# Patient Record
Sex: Male | Born: 1995 | Race: Black or African American | Hispanic: No | Marital: Single | Smoking: Never smoker
Health system: Southern US, Community
[De-identification: ages and names within clinical notes are randomized; demographics above are authoritative.]

## PROBLEM LIST (undated history)

## (undated) DIAGNOSIS — R6520 Severe sepsis without septic shock: Secondary | ICD-10-CM

## (undated) DIAGNOSIS — M79669 Pain in unspecified lower leg: Secondary | ICD-10-CM

## (undated) DIAGNOSIS — Z969 Presence of functional implant, unspecified: Secondary | ICD-10-CM

## (undated) DIAGNOSIS — Z8781 Personal history of (healed) traumatic fracture: Secondary | ICD-10-CM

## (undated) DIAGNOSIS — Z8673 Personal history of transient ischemic attack (TIA), and cerebral infarction without residual deficits: Secondary | ICD-10-CM

## (undated) DIAGNOSIS — A419 Sepsis, unspecified organism: Secondary | ICD-10-CM

## (undated) DIAGNOSIS — T84623A Infection and inflammatory reaction due to internal fixation device of left tibia, initial encounter: Secondary | ICD-10-CM

## (undated) HISTORY — PX: FRACTURE SURGERY: SHX138

---

## 2015-03-30 ENCOUNTER — Emergency Department (HOSPITAL_COMMUNITY)
Admission: EM | Admit: 2015-03-30 | Discharge: 2015-03-30 | Disposition: A | Discharge status: Home / Self Care | Payer: Medicaid Other | Attending: Emergency Medicine | Admitting: Emergency Medicine

## 2015-03-30 ENCOUNTER — Encounter (HOSPITAL_COMMUNITY): Payer: Self-pay | Admitting: Emergency Medicine

## 2015-03-30 ENCOUNTER — Emergency Department (HOSPITAL_COMMUNITY): Payer: Medicaid Other

## 2015-03-30 DIAGNOSIS — R509 Fever, unspecified: Secondary | ICD-10-CM | POA: Insufficient documentation

## 2015-03-30 DIAGNOSIS — R519 Headache, unspecified: Secondary | ICD-10-CM

## 2015-03-30 DIAGNOSIS — H538 Other visual disturbances: Secondary | ICD-10-CM | POA: Diagnosis not present

## 2015-03-30 DIAGNOSIS — R51 Headache: Secondary | ICD-10-CM | POA: Diagnosis present

## 2015-03-30 LAB — URINALYSIS, ROUTINE W REFLEX MICROSCOPIC
Glucose, UA: NEGATIVE mg/dL
Hgb urine dipstick: NEGATIVE
Ketones, ur: NEGATIVE mg/dL
Nitrite: NEGATIVE
Protein, ur: NEGATIVE mg/dL
Specific Gravity, Urine: 1.035 — ABNORMAL HIGH (ref 1.005–1.030)
Urobilinogen, UA: 4 mg/dL — ABNORMAL HIGH (ref 0.0–1.0)
pH: 6.5 (ref 5.0–8.0)

## 2015-03-30 LAB — CBC WITH DIFFERENTIAL/PLATELET
Basophils Absolute: 0 K/uL (ref 0.0–0.1)
Basophils Relative: 0 %
Eosinophils Absolute: 0.4 K/uL (ref 0.0–0.7)
Eosinophils Relative: 5 %
HCT: 38.4 % — ABNORMAL LOW (ref 39.0–52.0)
Hemoglobin: 14.1 g/dL (ref 13.0–17.0)
Lymphocytes Relative: 32 %
Lymphs Abs: 2.3 K/uL (ref 0.7–4.0)
MCH: 29.9 pg (ref 26.0–34.0)
MCHC: 36.7 g/dL — ABNORMAL HIGH (ref 30.0–36.0)
MCV: 81.4 fL (ref 78.0–100.0)
Monocytes Absolute: 1.7 K/uL — ABNORMAL HIGH (ref 0.1–1.0)
Monocytes Relative: 23 %
Neutro Abs: 3 K/uL (ref 1.7–7.7)
Neutrophils Relative %: 41 %
Platelets: 136 K/uL — ABNORMAL LOW (ref 150–400)
RBC: 4.72 MIL/uL (ref 4.22–5.81)
RDW: 12.3 % (ref 11.5–15.5)
WBC: 7.3 K/uL (ref 4.0–10.5)

## 2015-03-30 LAB — BASIC METABOLIC PANEL WITH GFR
Anion gap: 9 (ref 5–15)
BUN: 10 mg/dL (ref 6–20)
CO2: 26 mmol/L (ref 22–32)
Calcium: 9.5 mg/dL (ref 8.9–10.3)
Chloride: 101 mmol/L (ref 101–111)
Creatinine, Ser: 0.75 mg/dL (ref 0.61–1.24)
GFR calc Af Amer: >60 mL/min
GFR calc non Af Amer: >60 mL/min
Glucose, Bld: 92 mg/dL (ref 65–99)
Potassium: 4 mmol/L (ref 3.5–5.1)
Sodium: 136 mmol/L (ref 135–145)

## 2015-03-30 LAB — URINE MICROSCOPIC-ADD ON

## 2015-03-30 MED ORDER — DIPHENHYDRAMINE HCL 50 MG/ML IJ SOLN
25.0000 mg | Freq: Once | INTRAMUSCULAR | Status: DC
  Filled 2015-03-30: qty 1

## 2015-03-30 MED ORDER — DEXAMETHASONE SODIUM PHOSPHATE 10 MG/ML IJ SOLN
10.0000 mg | Freq: Once | INTRAMUSCULAR | Status: DC
  Filled 2015-03-30: qty 1

## 2015-03-30 MED ORDER — KETOROLAC TROMETHAMINE 30 MG/ML IJ SOLN
30.0000 mg | Freq: Once | INTRAMUSCULAR | Status: DC
  Filled 2015-03-30: qty 1

## 2015-03-30 MED ORDER — METOCLOPRAMIDE HCL 5 MG/ML IJ SOLN
10.0000 mg | Freq: Once | INTRAMUSCULAR | Status: DC
  Filled 2015-03-30: qty 2

## 2015-03-30 NOTE — ED Provider Notes (Signed)
  Face-to-face evaluation   History: Patient here for evaluation of headache and vague neurologic symptoms.  Physical exam: Alert, calm, cooperative. Pupils equal, round, reactive to light. Extraocular muscles are intact.  Medical screening examination/treatment/procedure(s) were conducted as a shared visit with non-physician practitioner(s) and myself.  I personally evaluated the patient during the encounter  Mancel Bale, MD 03/31/15 1506

## 2015-03-30 NOTE — ED Notes (Signed)
Translator stated I pee at night

## 2015-03-30 NOTE — ED Notes (Signed)
Has lived in [place] for 5 months.

## 2015-03-30 NOTE — ED Notes (Addendum)
Pt c/o headache and felling feverish through interpreter. Pt pointing to top of head for pain.  Denies stiff neck, able to touch chin to chest. Photophobic, aunt is here as a patient, being seen in fast track, pt doesn't know why she is here.   Pt states has "problems with urinating" -- when asked through interpreter-- states "it just comes out, just pee on myself." Having discharged, clear in color"

## 2015-03-30 NOTE — Discharge Instructions (Signed)
Use Tylenol, as needed for headache. Follow-up with the neurology office, and the primary care office for further care and treatment.   General Headache Without Cause A headache is pain or discomfort felt around the head or neck area. The specific cause of a headache may not be found. There are many causes and types of headaches. A few common ones are:  Tension headaches.  Migraine headaches.  Cluster headaches.  Chronic daily headaches. HOME CARE INSTRUCTIONS   Keep all follow-up appointments with your caregiver or any specialist referral.  Only take over-the-counter or prescription medicines for pain or discomfort as directed by your caregiver.  Lie down in a dark, quiet room when you have a headache.  Keep a headache journal to find out what may trigger your migraine headaches. For example, write down:  What you eat and drink.  How much sleep you get.  Any change to your diet or medicines.  Try massage or other relaxation techniques.  Put ice packs or heat on the head and neck. Use these 3 to 4 times per day for 15 to 20 minutes each time, or as needed.  Limit stress.  Sit up straight, and do not tense your muscles.  Quit smoking if you smoke.  Limit alcohol use.  Decrease the amount of caffeine you drink, or stop drinking caffeine.  Eat and sleep on a regular schedule.  Get 7 to 9 hours of sleep, or as recommended by your caregiver.  Keep lights dim if bright lights bother you and make your headaches worse. SEEK MEDICAL CARE IF:   You have problems with the medicines you were prescribed.  Your medicines are not working.  You have a change from the usual headache.  You have nausea or vomiting. SEEK IMMEDIATE MEDICAL CARE IF:   Your headache becomes severe.  You have a fever.  You have a stiff neck.  You have loss of vision.  You have muscular weakness or loss of muscle control.  You start losing your balance or have trouble walking.  You feel  faint or pass out.  You have severe symptoms that are different from your first symptoms. MAKE SURE YOU:   Understand these instructions.  Will watch your condition.  Will get help right away if you are not doing well or get worse. Document Released: 06/22/2005 Document Revised: 09/14/2011 Document Reviewed: 07/08/2011 [facility] Patient Information 2015 [place], [place]. This information is not intended to replace advice given to you by your health care provider. Make sure you discuss any questions you have with your health care provider.

## 2015-03-30 NOTE — ED Notes (Signed)
Translator stated, headache very warm.

## 2015-03-30 NOTE — ED Notes (Addendum)
Interpreter used from [employer]. Swahili -- primary language.

## 2015-03-30 NOTE — ED Provider Notes (Signed)
CSN: [id number]     Arrival date & time 03/30/15  1120 History   First MD Initiated Contact with Patient 03/30/15 1208     Chief Complaint  Patient presents with  . Headache     (Consider location/radiation/quality/duration/timing/severity/associated sxs/prior Treatment) HPI Comments: Jeremiah [name] is a 19 y.o M from [place] with no known medical history, speaking only Swahili, who presents to the emergency department complaining of headache that began yesterday. [name] interpreter was used. Patient states his head began hurting yesterday. Difficulty obtaining ROS from patient. Denies neck stiffness, chronic medical conditions. Denies acute change in vision however states that when he was in [name] he felt that if he stared at objects on and off his eyes are blurry and began to see black spots. Patient also states that he has been feeling feverish however denies feeling sweaty or having chills. Patient unable to describe what he means by feverish or why he feels that way. Denies nausea, vomiting, diarrhea, dysuria. Patient states he has previously had issues "with urine problems". Patient states "the urine just keeps coming and IP him myself at night". Endorses clear discharge. Denies sexual activity. Patient has been in [place] for 5 months.  Patient is a 19 y.o. male presenting with headaches. The history is provided by the patient. The history is limited by a language barrier. A language interpreter was used.  Headache   History reviewed. No pertinent past medical history. History reviewed. No pertinent past surgical history. No family history on file. Social History  Substance Use Topics  . Smoking status: Never Smoker   . Smokeless tobacco: None  . Alcohol Use: No    Review of Systems  Unable to perform ROS Neurological: Positive for headaches.      Allergies  Review of patient's allergies indicates not on file.  Home Medications   Prior to Admission  medications   Not on File   BP 111/63 mmHg  Pulse 67  Temp(Src) 97.8 F (36.6 C) (Oral)  Resp 17  Ht  (1.651 m)  Wt 126 lb 12.8 oz (57.516 kg)  BMI 21.10 kg/m2  SpO2 98% Physical Exam  Constitutional: He is oriented to person, place, and time. He appears well-developed and well-nourished. No distress.  HENT:  Head: Normocephalic and atraumatic.  Mouth/Throat: Oropharynx is clear and moist. No oropharyngeal exudate.  Eyes: Conjunctivae and EOM are normal. Pupils are equal, round, and reactive to light. Right eye exhibits no discharge. Left eye exhibits no discharge. No scleral icterus.  Neck: Normal range of motion. Neck supple.  No meningismus.   Cardiovascular: Normal rate, regular rhythm, normal heart sounds and intact distal pulses.  Exam reveals no gallop and no friction rub.   No murmur heard. Pulmonary/Chest: Effort normal and breath sounds normal. No respiratory distress. He has no wheezes. He has no rales. He exhibits no tenderness.  Abdominal: Soft. Bowel sounds are normal. He exhibits no distension and no mass. There is no tenderness. There is no rebound and no guarding.  Musculoskeletal: Normal range of motion. He exhibits no edema or tenderness.  Neurological: He is alert and oriented to person, place, and time. No cranial nerve deficit. He exhibits normal muscle tone.  Strength 5/5 throughout. No sensory deficits.  No gait abnormality.  Skin: Skin is warm and dry. No rash noted. He is not diaphoretic. No erythema. No pallor.  Nursing note and vitals reviewed.   ED Course  Procedures (including critical care time)    Labs Review Labs  Reviewed  CBC WITH DIFFERENTIAL/PLATELET - Abnormal; Notable for the following:    HCT 38.4 (*)    MCHC 36.7 (*)    Platelets 136 (*)    Monocytes Absolute 1.7 (*)    All other components within normal limits  URINALYSIS, ROUTINE W REFLEX MICROSCOPIC (NOT AT [facility]) - Abnormal; Notable for the following:    Color, Urine AMBER  (*)    Specific Gravity, Urine 1.035 (*)    Bilirubin Urine SMALL (*)    Urobilinogen, UA 4.0 (*)    Leukocytes, UA SMALL (*)    All other components within normal limits  URINE MICROSCOPIC-ADD ON - Abnormal; Notable for the following:    Squamous Epithelial / LPF FEW (*)    All other components within normal limits  BASIC METABOLIC PANEL    Imaging Review Ct Head Wo Contrast  03/30/2015   CLINICAL DATA:  Headache.  EXAM: CT HEAD WITHOUT CONTRAST  TECHNIQUE: Contiguous axial images were obtained from the base of the skull through the vertex without intravenous contrast.  COMPARISON:  None.  FINDINGS: Old lacunar infarcts in the left basal ganglia. No acute intracranial abnormality. Specifically, no hemorrhage, hydrocephalus, mass lesion, acute infarction, or significant intracranial injury. No acute calvarial abnormality.  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 No acute intracranial abnormality.   Electronically Signed   By: Charlett Nose M.D.   On: 03/30/2015 14:34   I have personally reviewed and evaluated these images and lab results as part of my medical decision-making.   EKG Interpretation None      MDM   Final diagnoses:  None    Patient clinically appears okay. Vital signs stable. However concern that language barrier and cultural differences may mask an infectious disease or other process. We will do basic lab work. Since patient is complaining of new onset headache that he has never happened before will order CT head. Also administer migraine cocktail for symptomatically relief.  CT head reveals old left basal ganglia lacunar infarcts. Unusual given patient age. Spoke with neuro recommend MRI.   Skeletal system patient family through interpreter. Mother will be leaving to go home and will return at 6 PM to pick up patient.  Pending MRI is negative patient can go home with neurology follow-up. Recommend antibiotics for UTI. Patient will need PCP  follow-up.  Patient signed out to Dr. Effie Shy at shift change.    Jeremiah Kinsman Pocola, PA-C 03/30/15 1530  Pricilla Loveless, MD 04/02/15 [id number]

## 2015-04-09 ENCOUNTER — Ambulatory Visit: Payer: Medicaid Other | Admitting: Neurology

## 2015-04-10 ENCOUNTER — Ambulatory Visit (INDEPENDENT_AMBULATORY_CARE_PROVIDER_SITE_OTHER): Payer: Medicaid Other | Admitting: Neurology

## 2015-04-10 ENCOUNTER — Encounter: Payer: Self-pay | Admitting: Neurology

## 2015-04-10 VITALS — BP 116/71 | HR 65 | Ht 65.0 in | Wt 129.0 lb

## 2015-04-10 DIAGNOSIS — G43709 Chronic migraine without aura, not intractable, without status migrainosus: Secondary | ICD-10-CM | POA: Diagnosis not present

## 2015-04-10 DIAGNOSIS — IMO0002 Reserved for concepts with insufficient information to code with codable children: Secondary | ICD-10-CM

## 2015-04-10 DIAGNOSIS — Z8673 Personal history of transient ischemic attack (TIA), and cerebral infarction without residual deficits: Secondary | ICD-10-CM | POA: Diagnosis not present

## 2015-04-10 MED ORDER — NAPROXEN 500 MG PO TABS
500.0000 mg | ORAL_TABLET | ORAL | Status: DC | PRN
Start: 2015-04-10 — End: 2015-06-04

## 2015-04-10 MED ORDER — NORTRIPTYLINE HCL 25 MG PO CAPS: 25.0000 mg | ORAL_CAPSULE | Freq: Every day | ORAL | Status: DC

## 2015-04-10 MED ORDER — CIPROFLOXACIN HCL 500 MG PO TABS
500.0000 mg | ORAL_TABLET | Freq: Two times a day (BID) | ORAL | Status: DC
Start: 2015-04-10 — End: 2015-05-03

## 2015-04-10 NOTE — Progress Notes (Signed)
PATIENT: Jeremiah [name] DOB: 09/30/1995  Chief Complaint  Patient presents with  . Headache    He only speaks Swahili and is here with a Nurse, learning disability.  Reports he has had daily headaches in varying degrees of pain over the last month.  He does not take any medication for them.  He was recently treated in the ED for one of his more severe headaches.     HISTORICAL  Jeremiah [name] is 19 yo male, native of [place], he is with his interpretor, seen in refer by  emergency room in April 10 2015 for evaluation of headaches  He immigrated to [place] about 6 months ago, lives with his family, reported new onset of headache since September 2016, he complains almost daily severe pounding holo-cranial headaches, lasting for couple hours, with associated light sensitivity, he has tried over-the-counter medications but could not remember the names which seems to help him some  He presented to the emergency room because of the severe headaches, I have reviewed CAT scan later MRI of the brain, 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 UA showed evidence of UTI   He denies visual loss, no lateralized motor or sensory deficit   REVIEW OF SYSTEMS: Full 14 system review of systems performed and notable only for as above   ALLERGIES: No Known Allergies  HOME MEDICATIONS: No current outpatient prescriptions on file.   No current facility-administered medications for this visit.    PAST MEDICAL HISTORY: Past Medical History  Diagnosis Date  . Headache     PAST SURGICAL HISTORY: Past Surgical History  Procedure Laterality Date  . No past surgery      FAMILY HISTORY: Family History  Problem Relation Age of Onset  . Healthy Mother     SOCIAL HISTORY:  Social History   Social History  . Marital Status: Single    Spouse Name: N/A  . Number of Children: 0  . Years of Education: 3rd grade    Occupational History  . Unemployed    Social History Main Topics  . Smoking status: Never Smoker   . Smokeless tobacco: Not on file  . Alcohol Use: No  . Drug Use: No  . Sexual Activity: Not on file   Other Topics Concern  . Not on file   Social History Narrative   Lives at home with his mother and brother.   Right-handed.   No caffeine use.     PHYSICAL EXAM   Filed Vitals:   04/10/15 1015  BP: 116/71  Pulse: 65  Height:  (1.651 m)  Weight: 129 lb (58.514 kg)    Not recorded      Body mass index is 21.47 kg/(m^2).  PHYSICAL EXAMNIATION:  Gen: NAD, conversant, well nourised, obese, well groomed                     Cardiovascular: Regular rate rhythm, no peripheral edema, warm, nontender. Eyes: Conjunctivae clear without exudates or hemorrhage Neck: Supple, no carotid bruise. Pulmonary: Clear to auscultation bilaterally   NEUROLOGICAL EXAM:  MENTAL STATUS: Speech:    Speech is normal; fluent and spontaneous with normal comprehension.  Cognition:     Orientation to time, place and person     Normal recent and remote memory     Normal Attention span and concentration     Normal Language, naming, repeating,spontaneous speech     Fund of knowledge  CRANIAL NERVES: CN II: Visual fields are full to confrontation. Fundoscopic exam is normal with sharp discs and no vascular changes. Pupils are round equal and briskly reactive to light. CN III, IV, VI: extraocular movement are normal. No ptosis. CN V: Facial sensation is intact to pinprick in all 3 divisions bilaterally. Corneal responses are intact.  CN VII: Face is symmetric with normal eye closure and smile. CN VIII: Hearing is normal to rubbing fingers CN IX, X: Palate elevates symmetrically. Phonation is normal. CN XI: Head turning and shoulder shrug are intact CN XII: Tongue is midline with normal movements and no atrophy.  MOTOR: There is no pronator drift of out-stretched arms. Muscle bulk and  tone are normal. Muscle strength is normal.  REFLEXES: Reflexes are 2+ and symmetric at the biceps, triceps, knees, and ankles. Plantar responses are flexor.  SENSORY: Intact to light touch, pinprick, position sense, and vibration sense are intact in fingers and toes.  COORDINATION: Rapid alternating movements and fine finger movements are intact. There is no dysmetria on finger-to-nose and heel-knee-shin.    GAIT/STANCE: Posture is normal. Gait is steady with normal steps, base, arm swing, and turning. Heel and toe walking are normal. Tandem gait is normal.  Romberg is absent.   DIAGNOSTIC DATA (LABS, IMAGING, TESTING) - I reviewed patient records, labs, notes, testing and imaging myself where available.   ASSESSMENT AND PLAN  Jeremiah [name] is a 19 y.o. male, native of [place], presenting with headache migraine features, normal neurological examinations, I have reviewed MRI of the brain with evidence of left basal ganglion stroke Chronic migraine  Add on preventive medications nortriptyline 25 mg every night  Naproxen 500 mg as needed  Headache diary  Return to clinic with nurse practitioner in one month  History of left basal ganglion stroke  Aspirin daily Urinary tract infection:  Cipro 500 mg twice a day for 5 days  Levert Feinstein, M.D. Ph.D.  [facility] [address], [address] [place], [place] [address] Ph: [phone] Fax: [phone]  CC:

## 2015-04-16 ENCOUNTER — Emergency Department (HOSPITAL_COMMUNITY): Payer: Medicaid Other | Admitting: Certified Registered"

## 2015-04-16 ENCOUNTER — Emergency Department (HOSPITAL_COMMUNITY): Payer: Medicaid Other

## 2015-04-16 ENCOUNTER — Inpatient Hospital Stay (HOSPITAL_COMMUNITY): Payer: Medicaid Other

## 2015-04-16 ENCOUNTER — Inpatient Hospital Stay (HOSPITAL_COMMUNITY)
Admission: EM | Admit: 2015-04-16 | Discharge: 2015-04-19 | DRG: 494 | Disposition: A | Discharge status: Home Health | Payer: Medicaid Other | Attending: Orthopedic Surgery | Admitting: Orthopedic Surgery

## 2015-04-16 ENCOUNTER — Encounter (HOSPITAL_COMMUNITY): Payer: Self-pay | Admitting: *Deleted

## 2015-04-16 ENCOUNTER — Encounter (HOSPITAL_COMMUNITY)
Admission: EM | Disposition: A | Discharge status: Home Health | Payer: Self-pay | Source: Home / Self Care | Attending: Orthopedic Surgery

## 2015-04-16 DIAGNOSIS — S82402B Unspecified fracture of shaft of left fibula, initial encounter for open fracture type I or II: Secondary | ICD-10-CM | POA: Diagnosis present

## 2015-04-16 DIAGNOSIS — S82209B Unspecified fracture of shaft of unspecified tibia, initial encounter for open fracture type I or II: Secondary | ICD-10-CM

## 2015-04-16 DIAGNOSIS — S82202B Unspecified fracture of shaft of left tibia, initial encounter for open fracture type I or II: Secondary | ICD-10-CM | POA: Diagnosis present

## 2015-04-16 DIAGNOSIS — Z09 Encounter for follow-up examination after completed treatment for conditions other than malignant neoplasm: Secondary | ICD-10-CM

## 2015-04-16 DIAGNOSIS — Z8781 Personal history of (healed) traumatic fracture: Secondary | ICD-10-CM

## 2015-04-16 DIAGNOSIS — S82409B Unspecified fracture of shaft of unspecified fibula, initial encounter for open fracture type I or II: Secondary | ICD-10-CM

## 2015-04-16 DIAGNOSIS — E876 Hypokalemia: Secondary | ICD-10-CM | POA: Diagnosis present

## 2015-04-16 DIAGNOSIS — Z419 Encounter for procedure for purposes other than remedying health state, unspecified: Secondary | ICD-10-CM

## 2015-04-16 DIAGNOSIS — R0989 Other specified symptoms and signs involving the circulatory and respiratory systems: Secondary | ICD-10-CM

## 2015-04-16 HISTORY — PX: TIBIA IM NAIL INSERTION: SHX2516

## 2015-04-16 HISTORY — DX: Personal history of (healed) traumatic fracture: Z87.81

## 2015-04-16 LAB — COMPREHENSIVE METABOLIC PANEL
ALT: 18 U/L (ref 17–63)
ANION GAP: 12 (ref 5–15)
AST: 35 U/L (ref 15–41)
Albumin: 4 g/dL (ref 3.5–5.0)
Alkaline Phosphatase: 89 U/L (ref 38–126)
BILIRUBIN TOTAL: 0.5 mg/dL (ref 0.3–1.2)
BUN: 13 mg/dL (ref 6–20)
CO2: 23 mmol/L (ref 22–32)
Calcium: 8.8 mg/dL — ABNORMAL LOW (ref 8.9–10.3)
Chloride: 98 mmol/L — ABNORMAL LOW (ref 101–111)
Creatinine, Ser: 1.34 mg/dL — ABNORMAL HIGH (ref 0.61–1.24)
GFR calc Af Amer: >60 mL/min (ref >60)
Glucose, Bld: 180 mg/dL — ABNORMAL HIGH (ref 65–99)
POTASSIUM: 3.2 mmol/L — AB (ref 3.5–5.1)
Sodium: 133 mmol/L — ABNORMAL LOW (ref 135–145)
TOTAL PROTEIN: 7.2 g/dL (ref 6.5–8.1)

## 2015-04-16 LAB — URINE MICROSCOPIC-ADD ON

## 2015-04-16 LAB — APTT: APTT: 29 s (ref 24–37)

## 2015-04-16 LAB — CBC WITH DIFFERENTIAL/PLATELET
BASOS ABS: 0.1 10*3/uL (ref 0.0–0.1)
Basophils Relative: 1 %
Eosinophils Absolute: 0.4 10*3/uL (ref 0.0–0.7)
Eosinophils Relative: 3 %
HEMATOCRIT: 39.6 % (ref 39.0–52.0)
Hemoglobin: 12.9 g/dL — ABNORMAL LOW (ref 13.0–17.0)
LYMPHS ABS: 8.7 10*3/uL — AB (ref 0.7–4.0)
Lymphocytes Relative: 66 %
MCH: 26.3 pg (ref 26.0–34.0)
MCHC: 32.6 g/dL (ref 30.0–36.0)
MCV: 80.7 fL (ref 78.0–100.0)
MONO ABS: 0.8 10*3/uL (ref 0.1–1.0)
MONOS PCT: 6 %
NEUTROS ABS: 3.1 10*3/uL (ref 1.7–7.7)
Neutrophils Relative %: 24 %
PLATELETS: 257 10*3/uL (ref 150–400)
RBC: 4.91 MIL/uL (ref 4.22–5.81)
RDW: 12.3 % (ref 11.5–15.5)
WBC: 13.1 10*3/uL — ABNORMAL HIGH (ref 4.0–10.5)

## 2015-04-16 LAB — RAPID URINE DRUG SCREEN, HOSP PERFORMED
Amphetamines: NOT DETECTED
BARBITURATES: NOT DETECTED
Benzodiazepines: POSITIVE — AB
COCAINE: NOT DETECTED
Opiates: POSITIVE — AB
TETRAHYDROCANNABINOL: NOT DETECTED

## 2015-04-16 LAB — CDS SEROLOGY

## 2015-04-16 LAB — PROTIME-INR
INR: 1.28 (ref 0.00–1.49)
PROTHROMBIN TIME: 16.1 s — AB (ref 11.6–15.2)

## 2015-04-16 LAB — URINALYSIS, ROUTINE W REFLEX MICROSCOPIC
Bilirubin Urine: NEGATIVE
Glucose, UA: NEGATIVE mg/dL
Ketones, ur: NEGATIVE mg/dL
LEUKOCYTES UA: NEGATIVE
Nitrite: NEGATIVE
PROTEIN: NEGATIVE mg/dL
Specific Gravity, Urine: 1.02 (ref 1.005–1.030)
UROBILINOGEN UA: 1 mg/dL (ref 0.0–1.0)
pH: 6.5 (ref 5.0–8.0)

## 2015-04-16 LAB — PATHOLOGIST SMEAR REVIEW

## 2015-04-16 LAB — TYPE AND SCREEN
ABO/RH(D): O POS
ANTIBODY SCREEN: NEGATIVE

## 2015-04-16 LAB — I-STAT CHEM 8, ED
BUN: 15 mg/dL (ref 6–20)
CHLORIDE: 103 mmol/L (ref 101–111)
Calcium, Ion: 1.08 mmol/L — ABNORMAL LOW (ref 1.12–1.23)
Creatinine, Ser: 1.2 mg/dL (ref 0.61–1.24)
Glucose, Bld: 177 mg/dL — ABNORMAL HIGH (ref 65–99)
HEMATOCRIT: 42 % (ref 39.0–52.0)
Hemoglobin: 14.3 g/dL (ref 13.0–17.0)
POTASSIUM: 3 mmol/L — AB (ref 3.5–5.1)
SODIUM: 136 mmol/L (ref 135–145)
TCO2: 22 mmol/L (ref 0–100)

## 2015-04-16 LAB — ABO/RH: ABO/RH(D): O POS

## 2015-04-16 LAB — ETHANOL: Alcohol, Ethyl (B): <5 mg/dL (ref <5)

## 2015-04-16 SURGERY — INSERTION, INTRAMEDULLARY ROD, TIBIA
Anesthesia: General | Laterality: Left

## 2015-04-16 SURGERY — Surgical Case
Anesthesia: *Unknown

## 2015-04-16 MED ORDER — CEFAZOLIN SODIUM-DEXTROSE 2-3 GM-% IV SOLR
INTRAVENOUS | Status: AC
  Filled 2015-04-16: qty 50

## 2015-04-16 MED ORDER — DEXTROSE 5 % IV SOLN
10.0000 mg | INTRAVENOUS | Status: DC | PRN
  Administered 2015-04-16: 25 ug/min via INTRAVENOUS

## 2015-04-16 MED ORDER — LACTATED RINGERS IV SOLN
INTRAVENOUS | Status: DC | PRN
  Administered 2015-04-16: 06:00:00 via INTRAVENOUS

## 2015-04-16 MED ORDER — NEOSTIGMINE METHYLSULFATE 10 MG/10ML IV SOLN
INTRAVENOUS | Status: AC
  Filled 2015-04-16: qty 1

## 2015-04-16 MED ORDER — ACETAMINOPHEN 325 MG PO TABS
650.0000 mg | ORAL_TABLET | Freq: Four times a day (QID) | ORAL | Status: DC | PRN
  Administered 2015-04-17 – 2015-04-19 (×3): 650 mg via ORAL
  Filled 2015-04-16 (×3): qty 2

## 2015-04-16 MED ORDER — CEFAZOLIN SODIUM 1-5 GM-% IV SOLN
INTRAVENOUS | Status: DC | PRN
  Administered 2015-04-16: 1 g via INTRAVENOUS

## 2015-04-16 MED ORDER — IOHEXOL 300 MG/ML  SOLN
100.0000 mL | Freq: Once | INTRAMUSCULAR | Status: AC | PRN
  Administered 2015-04-16: 100 mL via INTRAVENOUS

## 2015-04-16 MED ORDER — DOCUSATE SODIUM 100 MG PO CAPS
100.0000 mg | ORAL_CAPSULE | Freq: Two times a day (BID) | ORAL | Status: DC
  Administered 2015-04-17 – 2015-04-19 (×4): 100 mg via ORAL
  Filled 2015-04-16 (×3): qty 1

## 2015-04-16 MED ORDER — ROCURONIUM BROMIDE 100 MG/10ML IV SOLN
INTRAVENOUS | Status: DC | PRN
  Administered 2015-04-16: 25 mg via INTRAVENOUS

## 2015-04-16 MED ORDER — POTASSIUM CHLORIDE IN NACL 20-0.45 MEQ/L-% IV SOLN
INTRAVENOUS | Status: DC
  Administered 2015-04-16 – 2015-04-18 (×5): via INTRAVENOUS
  Filled 2015-04-16 (×12): qty 1000

## 2015-04-16 MED ORDER — VANCOMYCIN HCL 1000 MG IV SOLR
INTRAVENOUS | Status: AC
  Filled 2015-04-16: qty 1000

## 2015-04-16 MED ORDER — HYDROMORPHONE HCL 1 MG/ML IJ SOLN: 0.2500 mg | INTRAMUSCULAR | Status: DC | PRN

## 2015-04-16 MED ORDER — METOCLOPRAMIDE HCL 5 MG/ML IJ SOLN: 5.0000 mg | Freq: Three times a day (TID) | INTRAMUSCULAR | Status: DC | PRN

## 2015-04-16 MED ORDER — PROPOFOL 10 MG/ML IV BOLUS
INTRAVENOUS | Status: DC | PRN
  Administered 2015-04-16: 200 mg via INTRAVENOUS

## 2015-04-16 MED ORDER — NEOSTIGMINE METHYLSULFATE 10 MG/10ML IV SOLN
INTRAVENOUS | Status: DC | PRN
  Administered 2015-04-16: 3 mg via INTRAVENOUS

## 2015-04-16 MED ORDER — CEFAZOLIN SODIUM-DEXTROSE 2-3 GM-% IV SOLR
2.0000 g | Freq: Once | INTRAVENOUS | Status: AC
  Administered 2015-04-16: 2 g via INTRAVENOUS
  Filled 2015-04-16: qty 50

## 2015-04-16 MED ORDER — POLYETHYLENE GLYCOL 3350 17 G PO PACK: 17.0000 g | PACK | Freq: Every day | ORAL | Status: DC | PRN

## 2015-04-16 MED ORDER — INFLUENZA VAC SPLIT QUAD 0.5 ML IM SUSY
0.5000 mL | PREFILLED_SYRINGE | INTRAMUSCULAR | Status: DC
  Filled 2015-04-16 (×2): qty 0.5

## 2015-04-16 MED ORDER — OXYCODONE HCL 5 MG PO TABS
5.0000 mg | ORAL_TABLET | ORAL | Status: DC | PRN
  Administered 2015-04-16 – 2015-04-19 (×10): 10 mg via ORAL
  Filled 2015-04-16 (×10): qty 2

## 2015-04-16 MED ORDER — ONDANSETRON HCL 4 MG PO TABS: 4.0000 mg | ORAL_TABLET | Freq: Four times a day (QID) | ORAL | Status: DC | PRN

## 2015-04-16 MED ORDER — LIDOCAINE HCL (CARDIAC) 20 MG/ML IV SOLN
INTRAVENOUS | Status: DC | PRN
  Administered 2015-04-16: 100 mg via INTRAVENOUS

## 2015-04-16 MED ORDER — SODIUM CHLORIDE 0.9 % IV SOLN
INTRAVENOUS | Status: DC
  Administered 2015-04-16: 05:00:00 via INTRAVENOUS

## 2015-04-16 MED ORDER — TOBRAMYCIN SULFATE 1.2 G IJ SOLR
INTRAMUSCULAR | Status: AC
  Filled 2015-04-16: qty 1.2

## 2015-04-16 MED ORDER — PROMETHAZINE HCL 25 MG/ML IJ SOLN: 6.2500 mg | INTRAMUSCULAR | Status: DC | PRN

## 2015-04-16 MED ORDER — ONDANSETRON HCL 4 MG/2ML IJ SOLN
INTRAMUSCULAR | Status: DC | PRN
  Administered 2015-04-16: 4 mg via INTRAVENOUS

## 2015-04-16 MED ORDER — CEFAZOLIN SODIUM 1-5 GM-% IV SOLN
1.0000 g | Freq: Four times a day (QID) | INTRAVENOUS | Status: AC
  Administered 2015-04-16 (×3): 1 g via INTRAVENOUS
  Filled 2015-04-16 (×3): qty 50

## 2015-04-16 MED ORDER — 0.9 % SODIUM CHLORIDE (POUR BTL) OPTIME
TOPICAL | Status: DC | PRN
  Administered 2015-04-16: 1000 mL

## 2015-04-16 MED ORDER — FENTANYL CITRATE (PF) 100 MCG/2ML IJ SOLN
INTRAMUSCULAR | Status: DC | PRN
  Administered 2015-04-16: 50 ug via INTRAVENOUS
  Administered 2015-04-16: 100 ug via INTRAVENOUS

## 2015-04-16 MED ORDER — LIDOCAINE HCL (CARDIAC) 20 MG/ML IV SOLN
INTRAVENOUS | Status: AC
  Filled 2015-04-16: qty 5

## 2015-04-16 MED ORDER — PHENYLEPHRINE HCL 10 MG/ML IJ SOLN
INTRAMUSCULAR | Status: DC | PRN
  Administered 2015-04-16 (×5): 80 ug via INTRAVENOUS

## 2015-04-16 MED ORDER — TOBRAMYCIN SULFATE 1.2 G IJ SOLR
INTRAMUSCULAR | Status: DC | PRN
  Administered 2015-04-16: 1.2 g

## 2015-04-16 MED ORDER — TETANUS-DIPHTH-ACELL PERTUSSIS 5-2.5-18.5 LF-MCG/0.5 IM SUSP
0.5000 mL | Freq: Once | INTRAMUSCULAR | Status: AC
  Administered 2015-04-16: 0.5 mL via INTRAMUSCULAR
  Filled 2015-04-16: qty 0.5

## 2015-04-16 MED ORDER — ONDANSETRON HCL 4 MG/2ML IJ SOLN
4.0000 mg | Freq: Four times a day (QID) | INTRAMUSCULAR | Status: DC | PRN
  Administered 2015-04-16: 4 mg via INTRAVENOUS
  Filled 2015-04-16: qty 2

## 2015-04-16 MED ORDER — SUCCINYLCHOLINE CHLORIDE 20 MG/ML IJ SOLN
INTRAMUSCULAR | Status: AC
  Filled 2015-04-16: qty 1

## 2015-04-16 MED ORDER — ROCURONIUM BROMIDE 50 MG/5ML IV SOLN
INTRAVENOUS | Status: AC
  Filled 2015-04-16: qty 1

## 2015-04-16 MED ORDER — ONDANSETRON HCL 4 MG/2ML IJ SOLN
INTRAMUSCULAR | Status: AC
  Filled 2015-04-16: qty 2

## 2015-04-16 MED ORDER — MIDAZOLAM HCL 5 MG/5ML IJ SOLN
INTRAMUSCULAR | Status: DC | PRN
  Administered 2015-04-16: 2 mg via INTRAVENOUS

## 2015-04-16 MED ORDER — ROCURONIUM BROMIDE 50 MG/5ML IV SOLN
INTRAVENOUS | Status: AC
Start: 2015-04-16 — End: 2015-04-16
  Filled 2015-04-16: qty 1

## 2015-04-16 MED ORDER — ACETAMINOPHEN 650 MG RE SUPP: 650.0000 mg | Freq: Four times a day (QID) | RECTAL | Status: DC | PRN

## 2015-04-16 MED ORDER — PHENYLEPHRINE 40 MCG/ML (10ML) SYRINGE FOR IV PUSH (FOR BLOOD PRESSURE SUPPORT)
PREFILLED_SYRINGE | INTRAVENOUS | Status: AC
  Filled 2015-04-16: qty 10

## 2015-04-16 MED ORDER — VANCOMYCIN HCL 1000 MG IV SOLR
INTRAVENOUS | Status: DC | PRN
  Administered 2015-04-16: 1000 mg

## 2015-04-16 MED ORDER — ALBUMIN HUMAN 5 % IV SOLN
INTRAVENOUS | Status: DC | PRN
  Administered 2015-04-16: 07:00:00 via INTRAVENOUS

## 2015-04-16 MED ORDER — SUCCINYLCHOLINE CHLORIDE 20 MG/ML IJ SOLN
INTRAMUSCULAR | Status: DC | PRN
  Administered 2015-04-16: 120 mg via INTRAVENOUS

## 2015-04-16 MED ORDER — METOCLOPRAMIDE HCL 5 MG PO TABS: 5.0000 mg | ORAL_TABLET | Freq: Three times a day (TID) | ORAL | Status: DC | PRN

## 2015-04-16 MED ORDER — HYDROMORPHONE HCL 1 MG/ML IJ SOLN
1.0000 mg | INTRAMUSCULAR | Status: DC | PRN
  Administered 2015-04-16 – 2015-04-18 (×6): 1 mg via INTRAVENOUS
  Filled 2015-04-16 (×6): qty 1

## 2015-04-16 MED ORDER — GLYCOPYRROLATE 0.2 MG/ML IJ SOLN
INTRAMUSCULAR | Status: DC | PRN
  Administered 2015-04-16: 0.4 mg via INTRAVENOUS

## 2015-04-16 MED ORDER — FENTANYL CITRATE (PF) 100 MCG/2ML IJ SOLN
50.0000 ug | Freq: Once | INTRAMUSCULAR | Status: AC
  Administered 2015-04-16: 50 ug via INTRAVENOUS
  Filled 2015-04-16: qty 2

## 2015-04-16 MED ORDER — MIDAZOLAM HCL 2 MG/2ML IJ SOLN
INTRAMUSCULAR | Status: AC
  Filled 2015-04-16: qty 4

## 2015-04-16 MED ORDER — GLYCOPYRROLATE 0.2 MG/ML IJ SOLN
INTRAMUSCULAR | Status: AC
  Filled 2015-04-16: qty 2

## 2015-04-16 MED ORDER — FENTANYL CITRATE (PF) 250 MCG/5ML IJ SOLN
INTRAMUSCULAR | Status: AC
  Filled 2015-04-16: qty 5

## 2015-04-16 MED ORDER — PROPOFOL 10 MG/ML IV BOLUS
INTRAVENOUS | Status: AC
Start: 2015-04-16 — End: 2015-04-16
  Filled 2015-04-16: qty 20

## 2015-04-16 MED ORDER — ENOXAPARIN SODIUM 40 MG/0.4ML ~~LOC~~ SOLN
40.0000 mg | SUBCUTANEOUS | Status: DC
  Administered 2015-04-17 – 2015-04-19 (×3): 40 mg via SUBCUTANEOUS
  Filled 2015-04-16 (×4): qty 0.4

## 2015-04-16 SURGICAL SUPPLY — 79 items
BANDAGE ELASTIC 4 VELCRO ST LF (GAUZE/BANDAGES/DRESSINGS) IMPLANT
BANDAGE ELASTIC 6 VELCRO ST LF (GAUZE/BANDAGES/DRESSINGS) IMPLANT
BIT DRILL AO GAMMA 4.2X180 (BIT) ×3 IMPLANT
BIT DRILL AO GAMMA 4.2X340 (BIT) ×3 IMPLANT
BLADE SURG 10 STRL SS (BLADE) ×3 IMPLANT
BNDG COHESIVE 4X5 TAN STRL (GAUZE/BANDAGES/DRESSINGS) IMPLANT
BNDG GAUZE ELAST 4 BULKY (GAUZE/BANDAGES/DRESSINGS) ×3 IMPLANT
BONE CEMENT PALACOSE (Orthopedic Implant) ×3 IMPLANT
CANISTER WOUND CARE 500ML ATS (WOUND CARE) ×3 IMPLANT
CASSETTE VERAFLO VERALINK (MISCELLANEOUS) ×3 IMPLANT
CEMENT BONE PALACOSE (Orthopedic Implant) ×1 IMPLANT
CHLORAPREP W/TINT 26ML (MISCELLANEOUS) IMPLANT
COVER MAYO STAND STRL (DRAPES) ×3 IMPLANT
COVER SURGICAL LIGHT HANDLE (MISCELLANEOUS) ×6 IMPLANT
CUFF TOURNIQUET SINGLE 34IN LL (TOURNIQUET CUFF) IMPLANT
CUFF TOURNIQUET SINGLE 44IN (TOURNIQUET CUFF) IMPLANT
DRAPE C-ARM 42X72 X-RAY (DRAPES) ×3 IMPLANT
DRAPE C-ARMOR (DRAPES) ×3 IMPLANT
DRAPE IMP U-DRAPE 54X76 (DRAPES) ×3 IMPLANT
DRAPE PROXIMA HALF (DRAPES) ×3 IMPLANT
DRESSING OPSITE X SMALL 2X3 (GAUZE/BANDAGES/DRESSINGS) IMPLANT
DRSG ADAPTIC 3X8 NADH LF (GAUZE/BANDAGES/DRESSINGS) ×6 IMPLANT
DRSG CLEANSE VERAFLOW (GAUZE/BANDAGES/DRESSINGS) ×3 IMPLANT
DRSG MEPILEX BORDER 4X4 (GAUZE/BANDAGES/DRESSINGS) ×3 IMPLANT
DRSG MEPITEL 4X7.2 (GAUZE/BANDAGES/DRESSINGS) ×3 IMPLANT
DRSG TEGADERM 4X4.75 (GAUZE/BANDAGES/DRESSINGS) IMPLANT
DRSG VERAFLO VAC MED (GAUZE/BANDAGES/DRESSINGS) ×3 IMPLANT
ELECT CAUTERY BLADE 6.4 (BLADE) ×3 IMPLANT
ELECT REM PT RETURN 9FT ADLT (ELECTROSURGICAL) ×3
ELECTRODE REM PT RTRN 9FT ADLT (ELECTROSURGICAL) ×1 IMPLANT
GAUZE SPONGE 4X4 12PLY STRL (GAUZE/BANDAGES/DRESSINGS) ×3 IMPLANT
GLOVE BIO SURGEON STRL SZ7 (GLOVE) ×3 IMPLANT
GLOVE BIO SURGEON STRL SZ7.5 (GLOVE) ×6 IMPLANT
GLOVE BIO SURGEON STRL SZ8.5 (GLOVE) ×3 IMPLANT
GLOVE BIOGEL PI IND STRL 7.0 (GLOVE) ×1 IMPLANT
GLOVE BIOGEL PI IND STRL 8 (GLOVE) ×2 IMPLANT
GLOVE BIOGEL PI INDICATOR 7.0 (GLOVE) ×2
GLOVE BIOGEL PI INDICATOR 8 (GLOVE) ×4
GOWN STRL REUS W/ TWL LRG LVL3 (GOWN DISPOSABLE) ×2 IMPLANT
GOWN STRL REUS W/TWL LRG LVL3 (GOWN DISPOSABLE) ×4
GUIDEWIRE GAMMA (WIRE) ×6 IMPLANT
KIT BASIN OR (CUSTOM PROCEDURE TRAY) ×3 IMPLANT
KIT ROOM TURNOVER OR (KITS) ×3 IMPLANT
MANIFOLD NEPTUNE II (INSTRUMENTS) ×3 IMPLANT
NAIL TIBIAL STND 9X375MM (Nail) ×3 IMPLANT
NAIL TIBIALSTD 9X375MM (Nail) ×1 IMPLANT
NS IRRIG 1000ML POUR BTL (IV SOLUTION) ×3 IMPLANT
PACK ORTHO EXTREMITY (CUSTOM PROCEDURE TRAY) ×3 IMPLANT
PACK UNIVERSAL I (CUSTOM PROCEDURE TRAY) ×3 IMPLANT
PAD ARMBOARD 7.5X6 YLW CONV (MISCELLANEOUS) ×6 IMPLANT
PAD CAST 4YDX4 CTTN HI CHSV (CAST SUPPLIES) ×1 IMPLANT
PADDING CAST COTTON 4X4 STRL (CAST SUPPLIES) ×2
REAMER SHAFT BIXCUT (INSTRUMENTS) ×3 IMPLANT
SCREW LOCKING T2 F/T  5MMX35MM (Screw) ×2 IMPLANT
SCREW LOCKING T2 F/T  5MMX45MM (Screw) ×2 IMPLANT
SCREW LOCKING T2 F/T  5MMX65MM (Screw) ×2 IMPLANT
SCREW LOCKING T2 F/T  5X42.5MM (Screw) ×2 IMPLANT
SCREW LOCKING T2 F/T 5MMX35MM (Screw) ×1 IMPLANT
SCREW LOCKING T2 F/T 5MMX45MM (Screw) ×1 IMPLANT
SCREW LOCKING T2 F/T 5MMX65MM (Screw) ×1 IMPLANT
SCREW LOCKING T2 F/T 5X42.5MM (Screw) ×1 IMPLANT
SPONGE GAUZE 4X4 12PLY STER LF (GAUZE/BANDAGES/DRESSINGS) ×6 IMPLANT
SPONGE LAP 18X18 X RAY DECT (DISPOSABLE) ×3 IMPLANT
STAPLER VISISTAT 35W (STAPLE) IMPLANT
SUT ETHILON 3 0 PS 1 (SUTURE) ×9 IMPLANT
SUT MNCRL AB 4-0 PS2 18 (SUTURE) IMPLANT
SUT MON AB 2-0 CT1 27 (SUTURE) ×3 IMPLANT
SUT VIC AB 0 CT1 27 (SUTURE)
SUT VIC AB 0 CT1 27XBRD ANBCTR (SUTURE) IMPLANT
SUT VICRYL 0 CT 1 36IN (SUTURE) ×3 IMPLANT
SYR BULB IRRIGATION 50ML (SYRINGE) ×3 IMPLANT
TOWEL OR 17X24 6PK STRL BLUE (TOWEL DISPOSABLE) ×3 IMPLANT
TOWEL OR 17X26 10 PK STRL BLUE (TOWEL DISPOSABLE) ×3 IMPLANT
TOWEL OR NON WOVEN STRL DISP B (DISPOSABLE) ×3 IMPLANT
TRAY FOLEY CATH 14FRSI W/METER (CATHETERS) ×3 IMPLANT
TUBE CONNECTING 12'X1/4 (SUCTIONS) ×1
TUBE CONNECTING 12X1/4 (SUCTIONS) ×2 IMPLANT
WATER STERILE IRR 1000ML POUR (IV SOLUTION) ×3 IMPLANT
YANKAUER SUCT BULB TIP NO VENT (SUCTIONS) ×3 IMPLANT
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
## 2015-04-16 NOTE — Progress Notes (Addendum)
Late this afternoon, a Artist that knows Dariel's family came be to see him.  He was asking about some prescriptions that Nishanth was to be taking.  He left these with me and I called Dr. Greig Right office and spoke with Mellody Dance. This was patient information from his visit with Dr. Terrace Arabia, Neurology on October 5th.  He was given a 3 prescriptions, one of these was for Cipro for a UTI, we will get another UA to rule out UTI.  According to his visit, he has a history of chronic migraines and history of a stroke which was concerning.  I also asked if we were to remove his foley in the AM and Mellody Dance said to leave this in place until the patient works with PT.  I have placed this information in his chart for Dr. Eulah Pont to review.    When patient was brought from PACU, his foot was still cool and dry, and he was unable to wiggle his toes.  His foot is now warm to touch, he can wiggle all toes and is able to feel when you touch his foot or toes.

## 2015-04-16 NOTE — Anesthesia Procedure Notes (Signed)
Procedure Name: Intubation Date/Time: 04/16/2015 6:41 AM Performed by: Arlice Colt B Pre-anesthesia Checklist: Patient identified, Emergency Drugs available, Suction available, Patient being monitored and Timeout performed Patient Re-evaluated:Patient Re-evaluated prior to inductionOxygen Delivery Method: Circle system utilized Preoxygenation: Pre-oxygenation with 100% oxygen Intubation Type: IV induction and Rapid sequence Laryngoscope Size: Mac and 4 Grade View: Grade I Tube type: Oral Tube size: 7.5 mm Number of attempts: 1 Airway Equipment and Method: Stylet Placement Confirmation: ETT inserted through vocal cords under direct vision,  positive ETCO2 and breath sounds checked- equal and bilateral Secured at: 22 cm Tube secured with: Tape Dental Injury: Teeth and Oropharynx as per pre-operative assessment
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
## 2015-04-16 NOTE — Care Management (Signed)
Utilization review completed. Ezana Hubbert, RN Case Manager [phone]. 

## 2015-04-16 NOTE — Anesthesia Preprocedure Evaluation (Addendum)
Anesthesia Evaluation  Patient identified by MRN, date of birth, ID band Patient awake    Reviewed: Allergy & Precautions, NPO status , Patient's Chart, lab work & pertinent test results  Airway   TM Distance: >3 FB   Mouth opening: Limited Mouth Opening  Dental  (+) Teeth Intact, Dental Advisory Given   Pulmonary neg pulmonary ROS,    Pulmonary exam normal        Cardiovascular negative cardio ROS Normal cardiovascular exam     Neuro/Psych negative neurological ROS  negative psych ROS   GI/Hepatic negative GI ROS, Neg liver ROS,   Endo/Other  negative endocrine ROS  Renal/GU negative Renal ROS  negative genitourinary   Musculoskeletal negative musculoskeletal ROS (+)   Abdominal   Peds negative pediatric ROS (+)  Hematology negative hematology ROS (+)   Anesthesia Other Findings   Reproductive/Obstetrics negative OB ROS                             Anesthesia Physical Anesthesia Plan  ASA: II and emergent  Anesthesia Plan: General   Post-op Pain Management:    Induction: Intravenous, Rapid sequence and Cricoid pressure planned  Airway Management Planned: Oral ETT  Additional Equipment:   Intra-op Plan:   Post-operative Plan: Extubation in OR  Informed Consent: I have reviewed the patients History and Physical, chart, labs and discussed the procedure including the risks, benefits and alternatives for the proposed anesthesia with the patient or authorized representative who has indicated his/her understanding and acceptance.   Dental advisory given  Plan Discussed with: CRNA, Anesthesiologist and Surgeon  Anesthesia Plan Comments: (Interpreter was used for the interview)       Anesthesia Quick Evaluation
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
## 2015-04-16 NOTE — Transfer of Care (Signed)
Immediate Anesthesia Transfer of Care Note  Patient: Jeremiah [name]  Procedure(s) Performed: Procedure(s): INTRAMEDULLARY (IM) NAIL TIBIAL  I&D of TIBIAL WOUND AND APPLICATION OF WOUND VAC (Left)  Patient Location: PACU  Anesthesia Type:General  Level of Consciousness: patient cooperative and responds to stimulation  Airway & Oxygen Therapy: Patient Spontanous Breathing and Patient connected to nasal cannula oxygen  Post-op Assessment: Report given to RN and Post -op Vital signs reviewed and stable  Post vital signs: Reviewed and stable  Last Vitals:  Filed Vitals:   04/16/15 0845  BP:   Pulse:   Temp: 36.3 C  Resp:     Complications: No apparent anesthesia complications
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
## 2015-04-16 NOTE — Anesthesia Postprocedure Evaluation (Signed)
  Anesthesia Post-op Note  Patient: Jeremiah [name]  Procedure(s) Performed: Procedure(s): INTRAMEDULLARY (IM) NAIL TIBIAL  I&D of TIBIAL WOUND AND APPLICATION OF WOUND VAC (Left)  Patient Location: PACU  Anesthesia Type:General  Level of Consciousness: awake and alert   Airway and Oxygen Therapy: Patient Spontanous Breathing  Post-op Pain: Controlled  Post-op Assessment: Post-op Vital signs reviewed, Patient's Cardiovascular Status Stable and Respiratory Function Stable  Post-op Vital Signs: Reviewed  Filed Vitals:   04/16/15 0925  BP: 113/50  Pulse: 80  Temp:   Resp: 11    Complications: No apparent anesthesia complications

## 2015-04-17 ENCOUNTER — Encounter (HOSPITAL_COMMUNITY): Payer: Self-pay | Admitting: Orthopedic Surgery

## 2015-04-17 MED ORDER — ACETAMINOPHEN 500 MG PO TABS: 1000.0000 mg | ORAL_TABLET | ORAL | Status: DC

## 2015-04-17 MED ORDER — CEFAZOLIN SODIUM-DEXTROSE 2-3 GM-% IV SOLR
2.0000 g | INTRAVENOUS | Status: AC
  Administered 2015-04-18: 2 g via INTRAVENOUS
  Filled 2015-04-17: qty 50

## 2015-04-17 MED ORDER — CHLORHEXIDINE GLUCONATE 4 % EX LIQD
60.0000 mL | Freq: Once | CUTANEOUS | Status: AC
  Administered 2015-04-18: 4 via TOPICAL
  Filled 2015-04-17: qty 60

## 2015-04-17 NOTE — Clinical Documentation Improvement (Signed)
Orthopedic  Please update your documentation within the medical record to reflect your response to this query. Thank you  Abnormal Lab/Test Results:  BMP findings  Possible Clinical Conditions associated with below indicators  Hypokalemia  Other Condition  Cannot Clinically Determine  Supporting Information: Results for Jeremiah [name], Jeremiah [name] (MRN [id number]) as of 04/17/2015 09:01  04/16/2015 04:56 04/16/2015 05:43  Potassium 3.2 (L) 3.0 (L)   Treatment Provided: Daily BMP 0.45 % NaCl with KCl 20 mEq / L infusion @ 100cc/hr: Admin Instructions: KVO when taking PO's well  Please exercise your independent, professional judgment when responding. A specific answer is not anticipated or expected.  Thank You,  Toribio Harbourphelia R Janique Hoefer, RN, BSN, CCDS Certified Clinical Documentation Specialist [place]: Health Information Management [phone]
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
## 2015-04-17 NOTE — Progress Notes (Signed)
Orthopedic Tech Progress Note Patient Details:  Jeremiah [name] 02/21/1996 [id number]  Ortho Devices Type of Ortho Device: CAM walker Ortho Device/Splint Location: lle Ortho Device/Splint Interventions: Application   Jullian Clayson 04/17/2015, 9:56 AM

## 2015-04-17 NOTE — Progress Notes (Addendum)
    This patient's recent BMP on 10/11 and 10/12 have revealed hypokalemia.  Continue with IV fluids.  Will recheck labs in the am.    Subjective:  POD#1 IM nail of the L tibia for open tibia fracture with extensive laceration repair. Patient reports pain as moderate.  Resting comfortably in bed.  Missionary visited the patient yesterday, and informed the nursing staff that he was diagnosed with a UTI and was to be on abx.  UA done yesterday was negative for leuks or nitrates.  Doesn't appear to have active UTI at this time.  Will have patient complete course of abx ordered by urology last weeks and keep follow up with them.  D/C foley this am.  Objective:   VITALS:   Filed Vitals:   04/17/15 0009 04/17/15 0352 04/17/15 1134 04/17/15 1300  BP: 119/55 121/61  134/62  Pulse: 109 118 142 119  Temp: 99.4 F (37.4 C) 100.3 F (37.9 C)  98.1 F (36.7 C)  TempSrc: Oral Oral    Resp: 16 16  16  Height:      Weight:      SpO2: 100% 100%  100%    Neurologically intact ABD soft Neurovascular intact Sensation intact distally Intact pulses distally  Wound vac actively draining  Lab Results  Component Value Date   WBC 13.1* 04/16/2015   HGB 14.3 04/16/2015   HCT 42.0 04/16/2015   MCV 80.7 04/16/2015   PLT 257 04/16/2015   BMET    Component Value Date/Time   NA 136 04/16/2015 0543   K 3.0* 04/16/2015 0543   CL 103 04/16/2015 0543   CO2 23 04/16/2015 0456   GLUCOSE 177* 04/16/2015 0543   BUN 15 04/16/2015 0543   CREATININE 1.20 04/16/2015 0543   CALCIUM 8.8* 04/16/2015 0456   GFRNONAA >60 04/16/2015 0456   GFRAA >60 04/16/2015 0456     Assessment/Plan: 1 Day Post-Op   Principal Problem:   Open fracture of tibia and fibula Active Problems:   Hypokalemia   Up with therapy NWB in the LLE Lovenox for dvt prophylaxis Will order CAM boot to protect fracture but still allow for doppler of pulses in foot and wound vac access.  Will continue IV fluids for hypokalemia.   Will recheck labs in the am.    Jeremiah [name] 04/17/2015, 2:48 PM Cell [phone]    
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
## 2015-04-18 ENCOUNTER — Inpatient Hospital Stay (HOSPITAL_COMMUNITY): Payer: Medicaid Other | Admitting: Anesthesiology

## 2015-04-18 ENCOUNTER — Encounter (HOSPITAL_COMMUNITY): Payer: Self-pay | Admitting: Anesthesiology

## 2015-04-18 ENCOUNTER — Encounter (HOSPITAL_COMMUNITY)
Admission: EM | Disposition: A | Discharge status: Home Health | Payer: Self-pay | Source: Home / Self Care | Attending: Orthopedic Surgery

## 2015-04-18 HISTORY — PX: I & D EXTREMITY: SHX5045

## 2015-04-18 LAB — BASIC METABOLIC PANEL
Anion gap: 6 (ref 5–15)
CO2: 31 mmol/L (ref 22–32)
Calcium: 8 mg/dL — ABNORMAL LOW (ref 8.9–10.3)
Chloride: 94 mmol/L — ABNORMAL LOW (ref 101–111)
Creatinine, Ser: 0.85 mg/dL (ref 0.61–1.24)
Glucose, Bld: 114 mg/dL — ABNORMAL HIGH (ref 65–99)
POTASSIUM: 3.8 mmol/L (ref 3.5–5.1)
SODIUM: 131 mmol/L — AB (ref 135–145)

## 2015-04-18 LAB — SURGICAL PCR SCREEN
MRSA, PCR: NEGATIVE
STAPHYLOCOCCUS AUREUS: NEGATIVE

## 2015-04-18 SURGERY — IRRIGATION AND DEBRIDEMENT EXTREMITY
Anesthesia: General | Site: Leg Lower | Laterality: Left

## 2015-04-18 MED ORDER — ONDANSETRON HCL 4 MG/2ML IJ SOLN
INTRAMUSCULAR | Status: AC
  Filled 2015-04-18: qty 2

## 2015-04-18 MED ORDER — SODIUM CHLORIDE 0.9 % IR SOLN
Status: DC | PRN
  Administered 2015-04-18 (×2): 3000 mL

## 2015-04-18 MED ORDER — PROPOFOL 10 MG/ML IV BOLUS
INTRAVENOUS | Status: DC | PRN
  Administered 2015-04-18: 140 mg via INTRAVENOUS
  Administered 2015-04-18 (×2): 20 mg via INTRAVENOUS
  Administered 2015-04-18: 10 mg via INTRAVENOUS
  Administered 2015-04-18: 20 mg via INTRAVENOUS

## 2015-04-18 MED ORDER — MIDAZOLAM HCL 2 MG/2ML IJ SOLN
INTRAMUSCULAR | Status: AC
  Filled 2015-04-18: qty 4

## 2015-04-18 MED ORDER — 0.9 % SODIUM CHLORIDE (POUR BTL) OPTIME
TOPICAL | Status: DC | PRN
  Administered 2015-04-18: 1000 mL

## 2015-04-18 MED ORDER — FENTANYL CITRATE (PF) 100 MCG/2ML IJ SOLN
INTRAMUSCULAR | Status: DC | PRN
  Administered 2015-04-18: 25 ug via INTRAVENOUS
  Administered 2015-04-18: 100 ug via INTRAVENOUS
  Administered 2015-04-18: 25 ug via INTRAVENOUS

## 2015-04-18 MED ORDER — LACTATED RINGERS IV SOLN
INTRAVENOUS | Status: DC
  Administered 2015-04-18: 12:00:00 via INTRAVENOUS
  Administered 2015-04-18: 50 mL/h via INTRAVENOUS

## 2015-04-18 MED ORDER — CEFAZOLIN SODIUM 1-5 GM-% IV SOLN
1.0000 g | Freq: Four times a day (QID) | INTRAVENOUS | Status: AC
  Administered 2015-04-18 – 2015-04-19 (×2): 1 g via INTRAVENOUS
  Filled 2015-04-18 (×3): qty 50

## 2015-04-18 MED ORDER — PROPOFOL 10 MG/ML IV BOLUS
INTRAVENOUS | Status: AC
  Filled 2015-04-18: qty 20

## 2015-04-18 MED ORDER — HYDROMORPHONE HCL 1 MG/ML IJ SOLN
INTRAMUSCULAR | Status: AC
  Filled 2015-04-18: qty 1

## 2015-04-18 MED ORDER — DEXTROSE 5 % IV SOLN
INTRAVENOUS | Status: DC | PRN
  Administered 2015-04-18: 12:00:00 via INTRAVENOUS

## 2015-04-18 MED ORDER — HYDROMORPHONE HCL 1 MG/ML IJ SOLN
0.2500 mg | INTRAMUSCULAR | Status: DC | PRN
  Administered 2015-04-18 (×2): 0.5 mg via INTRAVENOUS

## 2015-04-18 MED ORDER — LIDOCAINE HCL (CARDIAC) 20 MG/ML IV SOLN
INTRAVENOUS | Status: DC | PRN
  Administered 2015-04-18: 40 mg via INTRAVENOUS

## 2015-04-18 MED ORDER — MIDAZOLAM HCL 5 MG/5ML IJ SOLN
INTRAMUSCULAR | Status: DC | PRN
  Administered 2015-04-18: 2 mg via INTRAVENOUS

## 2015-04-18 MED ORDER — PHENYLEPHRINE HCL 10 MG/ML IJ SOLN
INTRAMUSCULAR | Status: DC | PRN
  Administered 2015-04-18 (×2): 40 ug via INTRAVENOUS

## 2015-04-18 MED ORDER — FENTANYL CITRATE (PF) 250 MCG/5ML IJ SOLN
INTRAMUSCULAR | Status: AC
  Filled 2015-04-18: qty 5

## 2015-04-18 SURGICAL SUPPLY — 47 items
BANDAGE ELASTIC 4 VELCRO ST LF (GAUZE/BANDAGES/DRESSINGS) ×3 IMPLANT
BANDAGE ELASTIC 6 VELCRO ST LF (GAUZE/BANDAGES/DRESSINGS) ×3 IMPLANT
BLADE SURG 10 STRL SS (BLADE) ×3 IMPLANT
BNDG COHESIVE 4X5 TAN STRL (GAUZE/BANDAGES/DRESSINGS) ×3 IMPLANT
BNDG ELASTIC 6X10 VLCR STRL LF (GAUZE/BANDAGES/DRESSINGS) ×3 IMPLANT
BNDG GAUZE ELAST 4 BULKY (GAUZE/BANDAGES/DRESSINGS) ×3 IMPLANT
CANISTER WOUND CARE 500ML ATS (WOUND CARE) ×3 IMPLANT
COVER SURGICAL LIGHT HANDLE (MISCELLANEOUS) ×3 IMPLANT
CUFF TOURNIQUET SINGLE 34IN LL (TOURNIQUET CUFF) IMPLANT
DRSG TEGADERM 4X4.75 (GAUZE/BANDAGES/DRESSINGS) ×6 IMPLANT
DURAPREP 26ML APPLICATOR (WOUND CARE) ×3 IMPLANT
ELECT REM PT RETURN 9FT ADLT (ELECTROSURGICAL)
ELECTRODE REM PT RTRN 9FT ADLT (ELECTROSURGICAL) IMPLANT
EVACUATOR 1/8 PVC DRAIN (DRAIN) IMPLANT
FACESHIELD WRAPAROUND (MASK) IMPLANT
GAUZE SPONGE 4X4 12PLY STRL (GAUZE/BANDAGES/DRESSINGS) ×3 IMPLANT
GAUZE XEROFORM 1X8 LF (GAUZE/BANDAGES/DRESSINGS) ×3 IMPLANT
GLOVE BIO SURGEON STRL SZ7 (GLOVE) ×3 IMPLANT
GLOVE BIO SURGEON STRL SZ7.5 (GLOVE) ×3 IMPLANT
GLOVE BIOGEL PI IND STRL 7.0 (GLOVE) ×1 IMPLANT
GLOVE BIOGEL PI INDICATOR 7.0 (GLOVE) ×2
GOWN STRL REUS W/ TWL LRG LVL3 (GOWN DISPOSABLE) ×2 IMPLANT
GOWN STRL REUS W/TWL LRG LVL3 (GOWN DISPOSABLE) ×4
HANDPIECE INTERPULSE COAX TIP (DISPOSABLE)
KIT BASIN OR (CUSTOM PROCEDURE TRAY) ×3 IMPLANT
KIT ROOM TURNOVER OR (KITS) ×3 IMPLANT
MANIFOLD NEPTUNE II (INSTRUMENTS) ×3 IMPLANT
NS IRRIG 1000ML POUR BTL (IV SOLUTION) ×3 IMPLANT
PACK ORTHO EXTREMITY (CUSTOM PROCEDURE TRAY) ×3 IMPLANT
PAD ARMBOARD 7.5X6 YLW CONV (MISCELLANEOUS) ×6 IMPLANT
PENCIL BUTTON HOLSTER BLD 10FT (ELECTRODE) IMPLANT
SET HNDPC FAN SPRY TIP SCT (DISPOSABLE) IMPLANT
SPONGE GAUZE 4X4 12PLY STER LF (GAUZE/BANDAGES/DRESSINGS) ×3 IMPLANT
SPONGE LAP 18X18 X RAY DECT (DISPOSABLE) ×6 IMPLANT
STOCKINETTE IMPERVIOUS 9X36 MD (GAUZE/BANDAGES/DRESSINGS) ×3 IMPLANT
SUT ETHILON 2 0 FS 18 (SUTURE) ×3 IMPLANT
SUT ETHILON 3 0 FSL (SUTURE) ×12 IMPLANT
SUT ETHILON 3 0 PS 1 (SUTURE) IMPLANT
TOWEL OR 17X24 6PK STRL BLUE (TOWEL DISPOSABLE) ×3 IMPLANT
TOWEL OR 17X26 10 PK STRL BLUE (TOWEL DISPOSABLE) ×3 IMPLANT
TOWEL OR NON WOVEN STRL DISP B (DISPOSABLE) ×3 IMPLANT
TUBE ANAEROBIC SPECIMEN COL (MISCELLANEOUS) IMPLANT
TUBE CONNECTING 12'X1/4 (SUCTIONS) ×1
TUBE CONNECTING 12X1/4 (SUCTIONS) ×2 IMPLANT
UNDERPAD 30X30 INCONTINENT (UNDERPADS AND DIAPERS) ×3 IMPLANT
WATER STERILE IRR 1000ML POUR (IV SOLUTION) ×3 IMPLANT
YANKAUER SUCT BULB TIP NO VENT (SUCTIONS) ×3 IMPLANT

## 2015-04-18 NOTE — H&P (View-Only) (Signed)
    This patient's recent BMP on 10/11 and 10/12 have revealed hypokalemia.  Continue with IV fluids.  Will recheck labs in the am.    Subjective:  POD#1 IM nail of the L tibia for open tibia fracture with extensive laceration repair. Patient reports pain as moderate.  Resting comfortably in bed.  Missionary visited the patient yesterday, and informed the nursing staff that he was diagnosed with a UTI and was to be on abx.  UA done yesterday was negative for leuks or nitrates.  Doesn't appear to have active UTI at this time.  Will have patient complete course of abx ordered by urology last weeks and keep follow up with them.  D/C foley this am.  Objective:   VITALS:   Filed Vitals:   04/17/15 0009 04/17/15 0352 04/17/15 1134 04/17/15 1300  BP: 119/55 121/61  134/62  Pulse: 109 118 142 119  Temp: 99.4 F (37.4 C) 100.3 F (37.9 C)  98.1 F (36.7 C)  TempSrc: Oral Oral    Resp: 16 16  16   Height:      Weight:      SpO2: 100% 100%  100%    Neurologically intact ABD soft Neurovascular intact Sensation intact distally Intact pulses distally  Wound vac actively draining  Lab Results  Component Value Date   WBC 13.1* 04/16/2015   HGB 14.3 04/16/2015   HCT 42.0 04/16/2015   MCV 80.7 04/16/2015   PLT 257 04/16/2015   BMET    Component Value Date/Time   NA 136 04/16/2015 0543   K 3.0* 04/16/2015 0543   CL 103 04/16/2015 0543   CO2 23 04/16/2015 0456   GLUCOSE 177* 04/16/2015 0543   BUN 15 04/16/2015 0543   CREATININE 1.20 04/16/2015 0543   CALCIUM 8.8* 04/16/2015 0456   GFRNONAA >60 04/16/2015 0456   GFRAA >60 04/16/2015 0456     Assessment/Plan: 1 Day Post-Op   Principal Problem:   Open fracture of tibia and fibula Active Problems:   Hypokalemia   Up with therapy NWB in the LLE Lovenox for dvt prophylaxis Will order CAM boot to protect fracture but still allow for doppler of pulses in foot and wound vac access.  Will continue IV fluids for hypokalemia.   Will recheck labs in the am.    Jeremiah BolognaKelly,Jeremiah [name] 04/17/2015, 2:48 PM Cell [phone]
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
## 2015-04-18 NOTE — Anesthesia Postprocedure Evaluation (Signed)
  Anesthesia Post-op Note  Patient: Jeremiah [name]  Procedure(s) Performed: Procedure(s): IRRIGATION AND DEBRIDEMENT EXTREMITY (Left)  Patient Location: PACU  Anesthesia Type:General  Level of Consciousness: awake and alert   Airway and Oxygen Therapy: Patient Spontanous Breathing  Post-op Pain: Controlled  Post-op Assessment: Post-op Vital signs reviewed, Patient's Cardiovascular Status Stable and Respiratory Function Stable  Post-op Vital Signs: Reviewed  Filed Vitals:   04/18/15 1349  BP: 133/67  Pulse: 117  Temp:   Resp: 15    Complications: No apparent anesthesia complications

## 2015-04-18 NOTE — Anesthesia Procedure Notes (Signed)
Procedure Name: LMA Insertion Date/Time: 04/18/2015 11:39 AM Performed by: Darcey NoraJAMES, Murriel Eidem B Pre-anesthesia Checklist: Patient identified, Emergency Drugs available, Suction available and Patient being monitored Patient Re-evaluated:Patient Re-evaluated prior to inductionOxygen Delivery Method: Circle system utilized Preoxygenation: Pre-oxygenation with 100% oxygen Intubation Type: IV induction Ventilation: Mask ventilation without difficulty LMA: LMA inserted LMA Size: 4.0 Number of attempts: 1 Tube secured with: Tape (taped across cheeks) Dental Injury: Teeth and Oropharynx as per pre-operative assessment
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
## 2015-04-18 NOTE — Anesthesia Preprocedure Evaluation (Addendum)
Anesthesia Evaluation  Patient identified by MRN, date of birth, ID band Patient awake    Reviewed: Allergy & Precautions, H&P , NPO status , Patient's Chart, lab work & pertinent test results  Airway Mallampati: II  TM Distance: >3 FB Neck ROM: Full    Dental no notable dental hx. (+) Teeth Intact, Dental Advisory Given   Pulmonary neg pulmonary ROS,    Pulmonary exam normal breath sounds clear to auscultation       Cardiovascular negative cardio ROS   Rhythm:Regular Rate:Normal     Neuro/Psych negative neurological ROS  negative psych ROS   GI/Hepatic negative GI ROS, Neg liver ROS,   Endo/Other  negative endocrine ROS  Renal/GU negative Renal ROS  negative genitourinary   Musculoskeletal   Abdominal   Peds  Hematology negative hematology ROS (+)   Anesthesia Other Findings Spoke with pt's brother who speaks and understands some English and then via phone with an interpreter.  Reproductive/Obstetrics negative OB ROS                           Anesthesia Physical Anesthesia Plan  ASA: II  Anesthesia Plan: General   Post-op Pain Management:    Induction: Intravenous  Airway Management Planned: LMA  Additional Equipment:   Intra-op Plan:   Post-operative Plan: Extubation in OR  Informed Consent: I have reviewed the patients History and Physical, chart, labs and discussed the procedure including the risks, benefits and alternatives for the proposed anesthesia with the patient or authorized representative who has indicated his/her understanding and acceptance.   Dental advisory given  Plan Discussed with: CRNA  Anesthesia Plan Comments:         Anesthesia Quick Evaluation

## 2015-04-18 NOTE — Transfer of Care (Signed)
Immediate Anesthesia Transfer of Care Note  Patient: Jeremiah [name]  Procedure(s) Performed: Procedure(s): IRRIGATION AND DEBRIDEMENT EXTREMITY (Left)  Patient Location: PACU  Anesthesia Type:General  Level of Consciousness: awake, sedated and patient cooperative  Airway & Oxygen Therapy: Patient Spontanous Breathing and Patient connected to nasal cannula oxygen  Post-op Assessment: Report given to RN, Post -op Vital signs reviewed and stable and Patient moving all extremities  Post vital signs: Reviewed and stable  Last Vitals:  Filed Vitals:   04/18/15 0525  BP: 125/64  Pulse: 121  Temp: 36.7 C  Resp: 18    Complications: No apparent anesthesia complications

## 2015-04-18 NOTE — Progress Notes (Signed)
Per Lindwood QuaBrittany Kelly, PA; patient is to maintain CAM walker boot at all times.  Patient during previous shift requested that CAM Walker boot be removed and nursing complied.  This am with use of the Swahili interpreter, patient was also asked if he could have the CAM walker boot placed and he declined.  Patient was also asked if the Lovenox could be administered and he declined. Brother was present for conversation.  OR nurse made aware Lovenox was not given in report, will reattempt to administer post surgery.    Settings for wound vac were verified prior to transport to surgery and were accurate at the time.  Nursing will communicate the importance of maintaining CAM walker boot and periodically verifying wound vac settings each shift.

## 2015-04-18 NOTE — Progress Notes (Signed)
Physical Therapy Cancellation Note  Pt is at surgery. Will follow.   Ralene BatheUhlenberg, Amory Zbikowski Kistler PT 04/18/2015  [phone]

## 2015-04-18 NOTE — Progress Notes (Signed)
OT Cancellation Note    04/18/15 1115  OT Visit Information  Reason Eval/Treat Not Completed Patient at procedure or test/ unavailable  Pt at surgery. Will check back as time allows.  Hurshel PartyBailey Saranya Harlin, M.S., OTR/L Pager: [phone]  04/18/15 11:16 AM
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
## 2015-04-19 ENCOUNTER — Encounter (HOSPITAL_COMMUNITY): Payer: Self-pay | Admitting: Orthopedic Surgery

## 2015-04-19 MED ORDER — DOCUSATE SODIUM 100 MG PO CAPS: 100.0000 mg | ORAL_CAPSULE | Freq: Two times a day (BID) | ORAL | Status: DC

## 2015-04-19 MED ORDER — OXYCODONE-ACETAMINOPHEN 5-325 MG PO TABS: 1.0000 | ORAL_TABLET | ORAL | Status: DC | PRN

## 2015-04-19 MED ORDER — ONDANSETRON HCL 4 MG PO TABS: 4.0000 mg | ORAL_TABLET | Freq: Three times a day (TID) | ORAL | Status: DC | PRN

## 2015-04-19 MED ORDER — ENOXAPARIN SODIUM 40 MG/0.4ML ~~LOC~~ SOLN: 40.0000 mg | SUBCUTANEOUS | Status: DC

## 2015-04-19 NOTE — Progress Notes (Signed)
Orthopedic Tech Progress Note Patient Details:  Jeremiah [name] 03/24/1996 [id number]  Ortho Devices Type of Ortho Device: Crutches Ortho Device/Splint Location: lle Ortho Device/Splint Interventions: Application   Saul FordyceJennifer C Meshelle Holness 04/19/2015, 2:21 PM
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
## 2015-04-19 NOTE — Progress Notes (Addendum)
     Subjective:  POD#1 of repeat I/D of the L leg open tibial fracture wound with complex laceration repair.  IM nail of the L tibia was done on 04/15/2015.  Patient reports pain as mild to moderate.  Resting comfortably in bed this morning.  Family is at the bedside.  Potassium level is improved on repeat BMP. Patient had been refusing the Lovenox injections and resisting wearing the CAM boot.  With the help of an interpreter, we discussed the importance of both of these.  The patient will work with PT today and then be discharge this afternoon with the take home wound vac.  They are understand of all instructions at this time.   Objective:   VITALS:   Filed Vitals:   04/18/15 1508 04/18/15 2122 04/19/15 0000 04/19/15 0529  BP: 131/63 142/62 132/56 138/64  Pulse: 118 130 123 133  Temp: 99.6 F (37.6 C) 97.6 F (36.4 C) 97.7 F (36.5 C) 97.9 F (36.6 C)  TempSrc: Oral Oral Oral Oral  Resp: 14 18 16 16   Height:      Weight:      SpO2: 100% 100% 100% 99%    Neurologically intact ABD soft Neurovascular intact Sensation intact distally Intact pulses distally Incision: dressing C/D/I Cam boot to the L leg Wound vac actively draining (changed to the take home vac this morning at the bedside)  Lab Results  Component Value Date   WBC 13.1* 04/16/2015   HGB 14.3 04/16/2015   HCT 42.0 04/16/2015   MCV 80.7 04/16/2015   PLT 257 04/16/2015   BMET    Component Value Date/Time   NA 131* 04/18/2015 0549   K 3.8 04/18/2015 0549   CL 94* 04/18/2015 0549   CO2 31 04/18/2015 0549   GLUCOSE 114* 04/18/2015 0549   BUN <5* 04/18/2015 0549   CREATININE 0.85 04/18/2015 0549   CALCIUM 8.0* 04/18/2015 0549   GFRNONAA >60 04/18/2015 0549   GFRAA >60 04/18/2015 0549     Assessment/Plan: 1 Day Post-Op   Principal Problem:   Open fracture of tibia and fibula Active Problems:   Hypokalemia   Up with therapy NWB in the LLE in the CAM boot at all times Incisional wound vac to  the tibial wound Lovenox for DVT prophylaxis Plan to discharge home this afternoon with follow up Wednesday for wound vac removal.    Jeremiah [name] 04/19/2015, 7:30 AM Cell [phone]
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
## 2015-04-19 NOTE — Progress Notes (Signed)
Gates Riggashidi Dimiceli to be D/C'd Home per MD order. Discussed with the patient and family caregiver and all questions fully answered. Family states understanding.   Medication List    TAKE these medications        docusate sodium 100 MG capsule  Commonly known as:  COLACE  Take 1 capsule (100 mg total) by mouth 2 (two) times daily.     enoxaparin 40 MG/0.4ML injection  Commonly known as:  LOVENOX  Inject 0.4 mLs (40 mg total) into the skin daily.     ondansetron 4 MG tablet  Commonly known as:  ZOFRAN  Take 1 tablet (4 mg total) by mouth every 8 (eight) hours as needed for nausea or vomiting.     oxyCODONE-acetaminophen 5-325 MG tablet  Commonly known as:  PERCOCET  Take 1-2 tablets by mouth every 4 (four) hours as needed for severe pain.        VVS, Skin clean, dry and intact without evidence of skin break down, no evidence of skin tears noted.  IV catheter discontinued intact. Site without signs and symptoms of complications. Dressing and pressure applied.  An After Visit Summary was printed and given to the patient.  Patient escorted via WC, and D/C home via private auto.  Kai LevinsJacobs, Aesha Agrawal N  04/19/2015 3:54 PM
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
## 2015-04-19 NOTE — Discharge Instructions (Signed)
No weight on the left leg and in the CAM boot at all times Elevate to help with swelling Wound vac will last for seven days.  It will be removed in the office on Wednesday.  Lovenox injections daily to prevent blood clots

## 2015-04-20 ENCOUNTER — Emergency Department (HOSPITAL_COMMUNITY)
Admission: EM | Admit: 2015-04-20 | Discharge: 2015-04-20 | Disposition: A | Discharge status: Home / Self Care | Payer: Medicaid Other | Attending: Emergency Medicine | Admitting: Emergency Medicine

## 2015-04-20 ENCOUNTER — Encounter (HOSPITAL_COMMUNITY): Payer: Self-pay | Admitting: Vascular Surgery

## 2015-04-20 DIAGNOSIS — Z7901 Long term (current) use of anticoagulants: Secondary | ICD-10-CM | POA: Insufficient documentation

## 2015-04-20 DIAGNOSIS — Z79899 Other long term (current) drug therapy: Secondary | ICD-10-CM | POA: Insufficient documentation

## 2015-04-20 DIAGNOSIS — Z9889 Other specified postprocedural states: Secondary | ICD-10-CM | POA: Diagnosis present

## 2015-04-20 DIAGNOSIS — R000 Tachycardia, unspecified: Secondary | ICD-10-CM | POA: Insufficient documentation

## 2015-04-20 NOTE — ED Provider Notes (Signed)
CSN: [id number]     Arrival date & time 04/20/15  2140 History   First MD Initiated Contact with Patient 04/20/15 2232     Chief Complaint  Patient presents with  . Post-op Problem     (Consider location/radiation/quality/duration/timing/severity/associated sxs/prior Treatment) HPI Comments: Patient is here today because his wound VAC.  His BP and not draining. She was discharged from the hospital yesterday after having a open tib-fib surgical repair by Dr. Margarita Ranaimothy Murphy on October 13/14th.  The history is provided by the patient.    History reviewed. No pertinent past medical history. Past Surgical History  Procedure Laterality Date  . Tibia im nail insertion Left 04/16/2015    Procedure: INTRAMEDULLARY (IM) NAIL TIBIAL  I&D of TIBIAL WOUND AND APPLICATION OF WOUND VAC;  Surgeon: Sheral Apleyimothy D Murphy, MD;  Location: [facility] OR;  Service: Orthopedics;  Laterality: Left;  . I&d extremity Left 04/18/2015    Procedure: IRRIGATION AND DEBRIDEMENT EXTREMITY;  Surgeon: Sheral Apleyimothy D Murphy, MD;  Location: [facility] OR;  Service: Orthopedics;  Laterality: Left;   No family history on file. Social History  Substance Use Topics  . Smoking status: Never Smoker   . Smokeless tobacco: Never Used  . Alcohol Use: No    Review of Systems  Constitutional: Negative for fever and chills.  Skin: Positive for wound.       Jeremiah [name], with surgical dressing in place.  No drainage noted.  Cam Walker in place as well.  Surgical drain attached to wound VAC that is beeping and not draining  All other systems reviewed and are negative.     Allergies  Review of patient's allergies indicates no known allergies.  Home Medications   Prior to Admission medications   Medication Sig Start Date End Date Taking? Authorizing Provider  docusate sodium (COLACE) 100 MG capsule Take 1 capsule (100 mg total) by mouth 2 (two) times daily. 04/19/15  Yes Brittney Kelly, PA-C  enoxaparin (LOVENOX) 40 MG/0.4ML injection Inject 0.4 mLs  (40 mg total) into the skin daily. 04/19/15  Yes Brittney Kelly, PA-C  ondansetron (ZOFRAN) 4 MG tablet Take 1 tablet (4 mg total) by mouth every 8 (eight) hours as needed for nausea or vomiting. 04/19/15  Yes Brittney Tresa EndoKelly, PA-C  oxyCODONE-acetaminophen (PERCOCET) 5-325 MG tablet Take 1-2 tablets by mouth every 4 (four) hours as needed for severe pain. 04/19/15  Yes Brittney Kelly, PA-C   BP 113/53 mmHg  Pulse 104  Temp(Src) 98.6 F (37 C) (Oral)  Resp 20  SpO2 100% Physical Exam  Constitutional: He is oriented to person, place, and time. He appears well-developed and well-nourished.  HENT:  Head: Normocephalic.  Eyes: Pupils are equal, round, and reactive to light.  Cardiovascular: Regular rhythm.  Tachycardia present.   Neurological: He is alert and oriented to person, place, and time.  Nursing note and vitals reviewed.   ED Course  Procedures (including critical care time) Labs Review Labs Reviewed - No data to display  Imaging Review No results found. I have personally reviewed and evaluated these images and lab results as part of my medical decision-making.   EKG Interpretation None     spoke with Dr. Wyline MoodWeiner, who suggest turning the wound VAC off for the night and having the patient see the clinic at 10 AM in the morning for replacement of the cartridge.  I verify that they do indeed have hours on Sunday.  MDM   Final diagnoses:  Post-operative state  Earley Favor, NP 04/20/15 2323  Earley Favor, NP 04/20/15 1610  Gilda Crease, MD 04/20/15 [phone]

## 2015-04-20 NOTE — ED Notes (Signed)
Pt reports to the ED for eval of post op problem. Pt had surgery done on his left lower leg and had a wound vac placed. It is supposed to be pulling blood out, however, it has stopped pulling the blood and is now making beeping noises. Pt A&Ox4, resp e/u, and skin warm and dry. He is tachycardic in the 100s-120s.

## 2015-04-20 NOTE — ED Notes (Signed)
Pt has wound vac that appears to need a new cartridge change; Pt states he received no education on how to change or empty wound vac; Consulting civil engineerCharge RN notified for assistance

## 2015-04-20 NOTE — Discharge Instructions (Signed)
I spoke with Dr. Wyline MoodWeiner.  Tonight he suggests that you go to the walk-in orthopedic clinic at 10 AM in the morning to have the cartridge changed

## 2015-04-21 ENCOUNTER — Other Ambulatory Visit: Payer: Self-pay | Admitting: Physician Assistant

## 2015-04-21 DIAGNOSIS — S82402C Unspecified fracture of shaft of left fibula, initial encounter for open fracture type IIIA, IIIB, or IIIC: Principal | ICD-10-CM

## 2015-04-21 DIAGNOSIS — S82202C Unspecified fracture of shaft of left tibia, initial encounter for open fracture type IIIA, IIIB, or IIIC: Secondary | ICD-10-CM

## 2015-04-21 NOTE — Progress Notes (Signed)
CM received call from PA requesting information concerning VAC.  Pt has Provena and cartridge (collection chamber is filled).  CM placed calls to both Jeremiah RavelPaige Walters and [employer] Toye.  Jeremiah Ravelaige Walters, [phone] called back to both PA and this CM and has exchanged current Provena to a new Provena.  PA states if fills again, a standard KCI vac may need to be placed and how could she arrange without another admission; PA will go thru office to arrange if necessary.  No other CM need were communicated.

## 2015-04-23 ENCOUNTER — Telehealth: Payer: Self-pay | Admitting: *Deleted
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
## 2015-04-24 NOTE — Telephone Encounter (Signed)
Lindwood QuaBrittany Kelly, [place] with [facility] called requesting for patient to be [facility].  [facility].  Informed [place]any that medicaid [facility] on a lot of people cards; however they are not our patients and have never been to our clinic before.  Five more visits were authorized.  [place]ittany really would like patient to get established due upcoming surgery and will need more than the 6 authorized visits.  Patient does not speak english and would need an interpreter.  Patient also has a follow up appointment for Friday 04/26/2015 at 10 AM.  Patient will need more visits due to having an open wound. [place]any would like a call back ASAP regarding this matter. Will forward to nursing supervisor Altamese DillingJeannette Richardson, RN.  Clovis PuMartin, Tamika L, RN

## 2015-05-03 ENCOUNTER — Encounter: Payer: Self-pay | Admitting: Family Medicine

## 2015-05-03 ENCOUNTER — Ambulatory Visit (INDEPENDENT_AMBULATORY_CARE_PROVIDER_SITE_OTHER): Payer: Medicaid Other | Admitting: Family Medicine

## 2015-05-03 VITALS — BP 114/61 | HR 87 | Temp 99.0°F | Ht 70.0 in | Wt 150.0 lb

## 2015-05-03 DIAGNOSIS — G43709 Chronic migraine without aura, not intractable, without status migrainosus: Secondary | ICD-10-CM | POA: Diagnosis not present

## 2015-05-03 DIAGNOSIS — Z7689 Persons encountering health services in other specified circumstances: Secondary | ICD-10-CM

## 2015-05-03 DIAGNOSIS — S8292XS Unspecified fracture of left lower leg, sequela: Secondary | ICD-10-CM | POA: Diagnosis not present

## 2015-05-03 DIAGNOSIS — Z7189 Other specified counseling: Secondary | ICD-10-CM

## 2015-05-03 DIAGNOSIS — IMO0002 Reserved for concepts with insufficient information to code with codable children: Secondary | ICD-10-CM
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
## 2015-05-03 NOTE — Patient Instructions (Signed)
Please follow up with neurology for headaches and Jeremiah [name] for surgery.  I recommend that you schedule an appointment with [facility]igrant clinic as well.  Plan to follow up with me as needed.

## 2015-05-06 NOTE — H&P (Signed)
  PREOPERATIVE H&P  Chief Complaint: OTHER MECHANICAL COMPLICATION OF OTHER INTERNAL OTHROPEDIC DEVICES IMPLANTS AND GRAFTS INITIAL ENCOUNTER DISPLACED FRACTURE OF LEFT TIBIAL SPINE   HPI: Jeremiah [name] is a 19 y.o. male who presents for preoperative history and physical with a diagnosis of OTHER MECHANICAL COMPLICATION OF OTHER INTERNAL OTHROPEDIC DEVICES IMPLANTS AND GRAFTS INITIAL ENCOUNTER DISPLACED FRACTURE OF LEFT TIBIAL SPINE . Symptoms are rated as moderate to severe, and have been worsening.  This is significantly impairing activities of daily living.  He has elected for surgical management.   Past Medical History  Diagnosis Date  . Headache   . Stroke (HCC)     basal ganglion stroke  . Migraine    Past Surgical History  Procedure Laterality Date  . No past surgery    . Tibia fracture surgery Left 04/16/15    Delbert HarnessMurphy Wainer   Social History   Social History  . Marital Status: Single    Spouse Name: N/A  . Number of Children: 0  . Years of Education: 3rd grade   Occupational History  . Unemployed    Social History Main Topics  . Smoking status: Never Smoker   . Smokeless tobacco: Not on file  . Alcohol Use: No  . Drug Use: No  . Sexual Activity: Not on file   Other Topics Concern  . Not on file   Social History Narrative   Lives at home with his mother and brother.   Right-handed.   No caffeine use.   Family History  Problem Relation Age of Onset  . Healthy Mother    No Known Allergies Prior to Admission medications   Medication Sig Start Date End Date Taking? Authorizing Provider  aspirin EC 81 MG tablet Take 81 mg by mouth daily.    Historical Provider, MD  naproxen (NAPROSYN) 500 MG tablet Take 1 tablet (500 mg total) by mouth as needed. 04/10/15   Levert FeinsteinYijun Yan, MD  nortriptyline (PAMELOR) 25 MG capsule Take 1 capsule (25 mg total) by mouth at bedtime. 04/10/15   Levert FeinsteinYijun Yan, MD     Positive ROS: All other systems have been reviewed and were  otherwise negative with the exception of those mentioned in the HPI and as above.  Physical Exam: General: Alert, no acute distress Cardiovascular: No pedal edema Respiratory: No cyanosis, no use of accessory musculature GI: No organomegaly, abdomen is soft and non-tender Skin: No lesions in the area of chief complaint Neurologic: Sensation intact distally Psychiatric: Patient is competent for consent with normal mood and affect Lymphatic: No axillary or cervical lymphadenopathy  MUSCULOSKELETAL:  L leg has a healing anterior knee incision without signs of infection.  L tibia open wound has new granulation tissue and is healing.  No signs of infection or necrotic tissue at this time.  Sensation intact with 2 + distal pulses.  Assessment: OTHER MECHANICAL COMPLICATION OF OTHER INTERNAL OTHROPEDIC DEVICES IMPLANTS AND GRAFTS INITIAL ENCOUNTER DISPLACED FRACTURE OF LEFT TIBIAL SPINE   Plan: Plan for Procedure(s): LEFT TIBIAL SHAFT HARDWARE REMOVAL WITH GRAFT BONE MAJOR LARGE   The risks benefits and alternatives were discussed with the patient including but not limited to the risks of nonoperative treatment, versus surgical intervention including infection, bleeding, nerve injury,  blood clots, cardiopulmonary complications, morbidity, mortality, among others, and they were willing to proceed.   Lynann BolognaKelly,Danuta Huseman Marie, PA-C  05/06/2015 2:18 PM

## 2015-05-07 DIAGNOSIS — M79669 Pain in unspecified lower leg: Secondary | ICD-10-CM

## 2015-05-07 DIAGNOSIS — Z969 Presence of functional implant, unspecified: Secondary | ICD-10-CM

## 2015-05-07 HISTORY — DX: Presence of functional implant, unspecified: Z96.9

## 2015-05-07 HISTORY — DX: Pain in unspecified lower leg: M79.669

## 2015-05-15 ENCOUNTER — Encounter: Payer: Self-pay | Admitting: Nurse Practitioner

## 2015-05-15 ENCOUNTER — Ambulatory Visit (INDEPENDENT_AMBULATORY_CARE_PROVIDER_SITE_OTHER): Payer: Medicaid Other | Admitting: Nurse Practitioner

## 2015-05-15 VITALS — BP 126/66 | HR 96

## 2015-05-15 DIAGNOSIS — G43709 Chronic migraine without aura, not intractable, without status migrainosus: Secondary | ICD-10-CM | POA: Diagnosis not present

## 2015-05-15 DIAGNOSIS — IMO0002 Reserved for concepts with insufficient information to code with codable children: Secondary | ICD-10-CM

## 2015-05-15 DIAGNOSIS — Z8673 Personal history of transient ischemic attack (TIA), and cerebral infarction without residual deficits: Secondary | ICD-10-CM | POA: Diagnosis not present
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
## 2015-05-15 NOTE — Progress Notes (Signed)
I have reviewed and agreed above plan. 

## 2015-05-15 NOTE — Patient Instructions (Signed)
Continue aspirin for secondary stroke prevention Continue Nortriptyline 25mg  daily F/U in 6 months

## 2015-05-31 ENCOUNTER — Encounter: Payer: Self-pay | Admitting: Nurse Practitioner

## 2015-06-03 ENCOUNTER — Encounter (HOSPITAL_BASED_OUTPATIENT_CLINIC_OR_DEPARTMENT_OTHER): Payer: Self-pay | Admitting: *Deleted

## 2015-06-04 ENCOUNTER — Encounter (HOSPITAL_BASED_OUTPATIENT_CLINIC_OR_DEPARTMENT_OTHER): Payer: Self-pay | Admitting: *Deleted

## 2015-06-04 NOTE — Pre-Procedure Instructions (Signed)
Neuro note from 05/15/2015 reviewed by Dr. Ivin Bootyrews; pt. OK to come for surgery

## 2015-06-06 NOTE — Pre-Procedure Instructions (Signed)
Alona BeneJoyce will be interpreter for pt., per [facility]; please call [phone] if surgery time changes.

## 2015-06-07 ENCOUNTER — Ambulatory Visit (HOSPITAL_BASED_OUTPATIENT_CLINIC_OR_DEPARTMENT_OTHER)
Admission: RE | Admit: 2015-06-07 | Discharge: 2015-06-08 | Disposition: A | Discharge status: Home / Self Care | Payer: Medicaid Other | Source: Ambulatory Visit | Attending: Orthopedic Surgery | Admitting: Orthopedic Surgery

## 2015-06-07 ENCOUNTER — Encounter (HOSPITAL_BASED_OUTPATIENT_CLINIC_OR_DEPARTMENT_OTHER)
Admission: RE | Disposition: A | Discharge status: Home / Self Care | Payer: Self-pay | Source: Ambulatory Visit | Attending: Orthopedic Surgery

## 2015-06-07 ENCOUNTER — Encounter (HOSPITAL_BASED_OUTPATIENT_CLINIC_OR_DEPARTMENT_OTHER): Payer: Self-pay | Admitting: *Deleted

## 2015-06-07 ENCOUNTER — Ambulatory Visit (HOSPITAL_BASED_OUTPATIENT_CLINIC_OR_DEPARTMENT_OTHER): Payer: Medicaid Other | Admitting: Anesthesiology

## 2015-06-07 DIAGNOSIS — T84197A Other mechanical complication of internal fixation device of bone of left lower leg, initial encounter: Secondary | ICD-10-CM | POA: Diagnosis not present

## 2015-06-07 DIAGNOSIS — S82402H Unspecified fracture of shaft of left fibula, subsequent encounter for open fracture type I or II with delayed healing: Secondary | ICD-10-CM

## 2015-06-07 DIAGNOSIS — Y831 Surgical operation with implant of artificial internal device as the cause of abnormal reaction of the patient, or of later complication, without mention of misadventure at the time of the procedure: Secondary | ICD-10-CM | POA: Insufficient documentation

## 2015-06-07 DIAGNOSIS — S82202H Unspecified fracture of shaft of left tibia, subsequent encounter for open fracture type I or II with delayed healing: Secondary | ICD-10-CM

## 2015-06-07 HISTORY — DX: Presence of functional implant, unspecified: Z96.9

## 2015-06-07 HISTORY — DX: Personal history of (healed) traumatic fracture: Z87.81

## 2015-06-07 HISTORY — DX: Personal history of transient ischemic attack (TIA), and cerebral infarction without residual deficits: Z86.73

## 2015-06-07 HISTORY — DX: Pain in unspecified lower leg: M79.669

## 2015-06-07 HISTORY — PX: HARDWARE REMOVAL: SHX979

## 2015-06-07 SURGERY — REMOVAL, HARDWARE
Anesthesia: General | Site: Leg Lower | Laterality: Left

## 2015-06-07 MED ORDER — LIDOCAINE HCL (CARDIAC) 20 MG/ML IV SOLN
INTRAVENOUS | Status: AC
  Filled 2015-06-07: qty 5

## 2015-06-07 MED ORDER — POTASSIUM CHLORIDE IN NACL 20-0.45 MEQ/L-% IV SOLN: INTRAVENOUS | Status: DC

## 2015-06-07 MED ORDER — SUCCINYLCHOLINE CHLORIDE 20 MG/ML IJ SOLN
INTRAMUSCULAR | Status: AC
  Filled 2015-06-07: qty 1

## 2015-06-07 MED ORDER — PROMETHAZINE HCL 25 MG/ML IJ SOLN
6.2500 mg | INTRAMUSCULAR | Status: DC | PRN
  Filled 2015-06-07: qty 1

## 2015-06-07 MED ORDER — KETOROLAC TROMETHAMINE 30 MG/ML IJ SOLN
INTRAMUSCULAR | Status: DC | PRN
  Administered 2015-06-07: 30 mg via INTRAVENOUS

## 2015-06-07 MED ORDER — ONDANSETRON HCL 4 MG PO TABS: 4.0000 mg | ORAL_TABLET | Freq: Four times a day (QID) | ORAL | Status: DC | PRN

## 2015-06-07 MED ORDER — ONDANSETRON HCL 4 MG/2ML IJ SOLN
INTRAMUSCULAR | Status: DC | PRN
  Administered 2015-06-07: 4 mg via INTRAVENOUS

## 2015-06-07 MED ORDER — METOCLOPRAMIDE HCL 5 MG/ML IJ SOLN: 5.0000 mg | Freq: Three times a day (TID) | INTRAMUSCULAR | Status: DC | PRN

## 2015-06-07 MED ORDER — DOCUSATE SODIUM 100 MG PO CAPS: 100.0000 mg | ORAL_CAPSULE | Freq: Two times a day (BID) | ORAL | Status: DC

## 2015-06-07 MED ORDER — 0.9 % SODIUM CHLORIDE (POUR BTL) OPTIME
TOPICAL | Status: DC | PRN
  Administered 2015-06-07: 400 mL

## 2015-06-07 MED ORDER — FENTANYL CITRATE (PF) 100 MCG/2ML IJ SOLN
50.0000 ug | INTRAMUSCULAR | Status: AC | PRN
  Administered 2015-06-07: 100 ug via INTRAVENOUS
  Administered 2015-06-07 (×4): 25 ug via INTRAVENOUS

## 2015-06-07 MED ORDER — ACETAMINOPHEN 650 MG RE SUPP: 650.0000 mg | Freq: Four times a day (QID) | RECTAL | Status: DC | PRN

## 2015-06-07 MED ORDER — PROPOFOL 10 MG/ML IV BOLUS
INTRAVENOUS | Status: DC | PRN
  Administered 2015-06-07: 200 mg via INTRAVENOUS

## 2015-06-07 MED ORDER — CHLORHEXIDINE GLUCONATE 4 % EX LIQD: 60.0000 mL | Freq: Once | CUTANEOUS | Status: DC

## 2015-06-07 MED ORDER — HYDROMORPHONE HCL 1 MG/ML IJ SOLN: 0.2500 mg | INTRAMUSCULAR | Status: DC | PRN

## 2015-06-07 MED ORDER — HYDROMORPHONE HCL 1 MG/ML IJ SOLN
1.0000 mg | INTRAMUSCULAR | Status: DC | PRN
  Administered 2015-06-08: 1 mg via INTRAVENOUS
  Filled 2015-06-07: qty 1

## 2015-06-07 MED ORDER — ONDANSETRON HCL 4 MG/2ML IJ SOLN
INTRAMUSCULAR | Status: AC
  Filled 2015-06-07: qty 2

## 2015-06-07 MED ORDER — MIDAZOLAM HCL 2 MG/2ML IJ SOLN
1.0000 mg | INTRAMUSCULAR | Status: DC | PRN
  Administered 2015-06-07: 2 mg via INTRAVENOUS

## 2015-06-07 MED ORDER — PHENYLEPHRINE HCL 10 MG/ML IJ SOLN
INTRAMUSCULAR | Status: DC | PRN
  Administered 2015-06-07: 40 ug via INTRAVENOUS

## 2015-06-07 MED ORDER — CEFAZOLIN SODIUM 1-5 GM-% IV SOLN
1.0000 g | Freq: Four times a day (QID) | INTRAVENOUS | Status: AC
  Administered 2015-06-07 – 2015-06-08 (×3): 1 g via INTRAVENOUS
  Filled 2015-06-07 (×2): qty 50

## 2015-06-07 MED ORDER — DEXAMETHASONE SODIUM PHOSPHATE 10 MG/ML IJ SOLN
INTRAMUSCULAR | Status: AC
  Filled 2015-06-07: qty 1

## 2015-06-07 MED ORDER — ACETAMINOPHEN 500 MG PO TABS
1000.0000 mg | ORAL_TABLET | Freq: Once | ORAL | Status: AC
  Administered 2015-06-07: 1000 mg via ORAL

## 2015-06-07 MED ORDER — BUPIVACAINE HCL (PF) 0.5 % IJ SOLN
INTRAMUSCULAR | Status: AC
  Filled 2015-06-07: qty 30

## 2015-06-07 MED ORDER — LIDOCAINE HCL (CARDIAC) 20 MG/ML IV SOLN
INTRAVENOUS | Status: DC | PRN
  Administered 2015-06-07: 80 mg via INTRAVENOUS

## 2015-06-07 MED ORDER — POTASSIUM CHLORIDE IN NACL 20-0.9 MEQ/L-% IV SOLN
INTRAVENOUS | Status: DC
  Administered 2015-06-07: 16:00:00 via INTRAVENOUS
  Filled 2015-06-07: qty 1000

## 2015-06-07 MED ORDER — ONDANSETRON HCL 4 MG PO TABS: 4.0000 mg | ORAL_TABLET | Freq: Three times a day (TID) | ORAL | Status: DC | PRN

## 2015-06-07 MED ORDER — MIDAZOLAM HCL 2 MG/2ML IJ SOLN
INTRAMUSCULAR | Status: AC
  Filled 2015-06-07: qty 2

## 2015-06-07 MED ORDER — ACETAMINOPHEN 500 MG PO TABS
ORAL_TABLET | ORAL | Status: AC
  Filled 2015-06-07: qty 2

## 2015-06-07 MED ORDER — CEFAZOLIN SODIUM-DEXTROSE 2-3 GM-% IV SOLR
2.0000 g | INTRAVENOUS | Status: AC
  Administered 2015-06-07: 2 g via INTRAVENOUS

## 2015-06-07 MED ORDER — GLYCOPYRROLATE 0.2 MG/ML IJ SOLN: 0.2000 mg | Freq: Once | INTRAMUSCULAR | Status: DC | PRN

## 2015-06-07 MED ORDER — BUPIVACAINE-EPINEPHRINE (PF) 0.5% -1:200000 IJ SOLN
INTRAMUSCULAR | Status: AC
  Filled 2015-06-07: qty 30

## 2015-06-07 MED ORDER — ONDANSETRON HCL 4 MG/2ML IJ SOLN: 4.0000 mg | Freq: Four times a day (QID) | INTRAMUSCULAR | Status: DC | PRN

## 2015-06-07 MED ORDER — BUPIVACAINE HCL (PF) 0.5 % IJ SOLN
INTRAMUSCULAR | Status: DC | PRN
  Administered 2015-06-07: 10 mL

## 2015-06-07 MED ORDER — FENTANYL CITRATE (PF) 100 MCG/2ML IJ SOLN
INTRAMUSCULAR | Status: AC
  Filled 2015-06-07: qty 2

## 2015-06-07 MED ORDER — METOCLOPRAMIDE HCL 5 MG PO TABS: 5.0000 mg | ORAL_TABLET | Freq: Three times a day (TID) | ORAL | Status: DC | PRN

## 2015-06-07 MED ORDER — OXYCODONE-ACETAMINOPHEN 5-325 MG PO TABS: 1.0000 | ORAL_TABLET | ORAL | Status: DC | PRN

## 2015-06-07 MED ORDER — KETOROLAC TROMETHAMINE 30 MG/ML IJ SOLN
INTRAMUSCULAR | Status: AC
Start: 2015-06-07 — End: 2015-06-07
  Filled 2015-06-07: qty 1

## 2015-06-07 MED ORDER — CEFAZOLIN SODIUM-DEXTROSE 2-3 GM-% IV SOLR
INTRAVENOUS | Status: AC
  Filled 2015-06-07: qty 50

## 2015-06-07 MED ORDER — SCOPOLAMINE 1 MG/3DAYS TD PT72: 1.0000 | MEDICATED_PATCH | Freq: Once | TRANSDERMAL | Status: DC | PRN

## 2015-06-07 MED ORDER — LACTATED RINGERS IV SOLN
INTRAVENOUS | Status: DC
  Administered 2015-06-07 (×2): via INTRAVENOUS

## 2015-06-07 MED ORDER — ACETAMINOPHEN 325 MG PO TABS
650.0000 mg | ORAL_TABLET | Freq: Four times a day (QID) | ORAL | Status: DC | PRN
  Administered 2015-06-07: 650 mg via ORAL
  Filled 2015-06-07: qty 2

## 2015-06-07 MED ORDER — DEXAMETHASONE SODIUM PHOSPHATE 4 MG/ML IJ SOLN
INTRAMUSCULAR | Status: DC | PRN
  Administered 2015-06-07: 10 mg via INTRAVENOUS

## 2015-06-07 MED ORDER — OXYCODONE HCL 5 MG PO TABS
5.0000 mg | ORAL_TABLET | ORAL | Status: DC | PRN
  Administered 2015-06-07 – 2015-06-08 (×4): 10 mg via ORAL
  Filled 2015-06-07 (×4): qty 2

## 2015-06-07 SURGICAL SUPPLY — 68 items
BANDAGE ELASTIC 4 VELCRO ST LF (GAUZE/BANDAGES/DRESSINGS) ×4 IMPLANT
BLADE SURG 15 STRL LF DISP TIS (BLADE) ×4 IMPLANT
BLADE SURG 15 STRL SS (BLADE) ×4
BNDG COHESIVE 4X5 TAN STRL (GAUZE/BANDAGES/DRESSINGS) ×4 IMPLANT
BNDG ESMARK 4X9 LF (GAUZE/BANDAGES/DRESSINGS) ×4 IMPLANT
BONE CANC CHIPS 40CC CAN1/2 (Bone Implant) ×4 IMPLANT
CHIPS CANC BONE 40CC CAN1/2 (Bone Implant) ×2 IMPLANT
CHLORAPREP W/TINT 26ML (MISCELLANEOUS) ×4 IMPLANT
CLOSURE STERI-STRIP 1/2X4 (GAUZE/BANDAGES/DRESSINGS)
CLSR STERI-STRIP ANTIMIC 1/2X4 (GAUZE/BANDAGES/DRESSINGS) IMPLANT
COVER BACK TABLE 60X90IN (DRAPES) ×4 IMPLANT
CUFF TOURNIQUET SINGLE 24IN (TOURNIQUET CUFF) ×4 IMPLANT
CUFF TOURNIQUET SINGLE 34IN LL (TOURNIQUET CUFF) IMPLANT
DECANTER SPIKE VIAL GLASS SM (MISCELLANEOUS) IMPLANT
DRAPE EXTREMITY T 121X128X90 (DRAPE) ×4 IMPLANT
DRAPE OEC MINIVIEW 54X84 (DRAPES) ×4 IMPLANT
DRAPE SURG 17X23 STRL (DRAPES) IMPLANT
DRAPE U 20/CS (DRAPES) ×4 IMPLANT
DRAPE U-SHAPE 47X51 STRL (DRAPES) IMPLANT
DRSG EMULSION OIL 3X3 NADH (GAUZE/BANDAGES/DRESSINGS) ×4 IMPLANT
ELECT REM PT RETURN 9FT ADLT (ELECTROSURGICAL) ×4
ELECTRODE REM PT RTRN 9FT ADLT (ELECTROSURGICAL) ×2 IMPLANT
GAUZE SPONGE 4X4 12PLY STRL (GAUZE/BANDAGES/DRESSINGS) ×4 IMPLANT
GAUZE XEROFORM 1X8 LF (GAUZE/BANDAGES/DRESSINGS) IMPLANT
GLOVE BIO SURGEON STRL SZ7 (GLOVE) ×8 IMPLANT
GLOVE BIO SURGEON STRL SZ7.5 (GLOVE) ×4 IMPLANT
GLOVE BIOGEL M 7.0 STRL (GLOVE) ×4 IMPLANT
GLOVE BIOGEL PI IND STRL 7.0 (GLOVE) ×2 IMPLANT
GLOVE BIOGEL PI IND STRL 7.5 (GLOVE) ×2 IMPLANT
GLOVE BIOGEL PI IND STRL 8 (GLOVE) ×2 IMPLANT
GLOVE BIOGEL PI INDICATOR 7.0 (GLOVE) ×2
GLOVE BIOGEL PI INDICATOR 7.5 (GLOVE) ×2
GLOVE BIOGEL PI INDICATOR 8 (GLOVE) ×2
GLOVE SURG SS PI 7.0 STRL IVOR (GLOVE) ×4 IMPLANT
GOWN STRL REUS W/ TWL LRG LVL3 (GOWN DISPOSABLE) ×8 IMPLANT
GOWN STRL REUS W/ TWL XL LVL3 (GOWN DISPOSABLE) ×2 IMPLANT
GOWN STRL REUS W/TWL LRG LVL3 (GOWN DISPOSABLE) ×8
GOWN STRL REUS W/TWL XL LVL3 (GOWN DISPOSABLE) ×2
KIT INFUSE LRG II (Orthopedic Implant) ×4 IMPLANT
NEEDLE HYPO 25X1 1.5 SAFETY (NEEDLE) ×4 IMPLANT
NS IRRIG 1000ML POUR BTL (IV SOLUTION) ×4 IMPLANT
PACK BASIN DAY SURGERY FS (CUSTOM PROCEDURE TRAY) ×4 IMPLANT
PAD CAST 4YDX4 CTTN HI CHSV (CAST SUPPLIES) ×2 IMPLANT
PADDING CAST ABS 4INX4YD NS (CAST SUPPLIES)
PADDING CAST ABS COTTON 4X4 ST (CAST SUPPLIES) IMPLANT
PADDING CAST COTTON 4X4 STRL (CAST SUPPLIES) ×2
PENCIL BUTTON HOLSTER BLD 10FT (ELECTRODE) ×4 IMPLANT
SHEET MEDIUM DRAPE 40X70 STRL (DRAPES) IMPLANT
SLEEVE SCD COMPRESS KNEE MED (MISCELLANEOUS) IMPLANT
SPONGE LAP 4X18 X RAY DECT (DISPOSABLE) ×4 IMPLANT
STOCKINETTE 4X48 STRL (DRAPES) IMPLANT
STOCKINETTE 6  STRL (DRAPES) ×2
STOCKINETTE 6 STRL (DRAPES) ×2 IMPLANT
SUCTION FRAZIER TIP 10 FR DISP (SUCTIONS) ×4 IMPLANT
SUT ETHILON 3 0 PS 1 (SUTURE) ×4 IMPLANT
SUT MON AB 4-0 PC3 18 (SUTURE) IMPLANT
SUT PROLENE 3 0 PS 2 (SUTURE) IMPLANT
SUT VIC AB 0 SH 27 (SUTURE) ×4 IMPLANT
SUT VIC AB 2-0 SH 27 (SUTURE)
SUT VIC AB 2-0 SH 27XBRD (SUTURE) IMPLANT
SUT VIC AB 3-0 FS2 27 (SUTURE) IMPLANT
SYR BULB 3OZ (MISCELLANEOUS) ×4 IMPLANT
SYR CONTROL 10ML LL (SYRINGE) ×4 IMPLANT
TOWEL OR 17X24 6PK STRL BLUE (TOWEL DISPOSABLE) ×4 IMPLANT
TOWEL OR NON WOVEN STRL DISP B (DISPOSABLE) IMPLANT
TUBE CONNECTING 20'X1/4 (TUBING) ×1
TUBE CONNECTING 20X1/4 (TUBING) ×3 IMPLANT
UNDERPAD 30X30 (UNDERPADS AND DIAPERS) ×4 IMPLANT

## 2015-06-07 NOTE — Transfer of Care (Signed)
Immediate Anesthesia Transfer of Care Note  Patient: Jeremiah [name]  Procedure(s) Performed: Procedure(s): LEFT TIBIAL SHAFT HARDWARE REMOVAL WITH ALLOGRAFT BONE AND INFUSE GRAFTS (Left)  Patient Location: PACU  Anesthesia Type:General  Level of Consciousness: awake, sedated, lethargic and responds to stimulation  Airway & Oxygen Therapy: Patient Spontanous Breathing and Patient connected to face mask oxygen  Post-op Assessment: Report given to RN and Post -op Vital signs reviewed and stable  Post vital signs: Reviewed and stable  Last Vitals:  Filed Vitals:   06/07/15 1117  BP: 132/74  Pulse: 84  Temp: 37 C  Resp: 18    Complications: No apparent anesthesia complications

## 2015-06-07 NOTE — Interval H&P Note (Signed)
History and Physical Interval Note:  06/07/2015 8:11 AM  Jeremiah [name]  has presented today for surgery, with the diagnosis of OTHER MECHANICAL COMPLICATION OF OTHER INTERNAL OTHROPEDIC DEVICES IMPLANTS AND GRAFTS INITIAL ENCOUNTER DISPLACED FRACTURE OF LEFT TIBIAL SPINE   The various methods of treatment have been discussed with the patient and family. After consideration of risks, benefits and other options for treatment, the patient has consented to  Procedure(s): LEFT TIBIAL SHAFT HARDWARE REMOVAL (Left) WITH GRAFT BONE MAJOR LARGE  (Left) as a surgical intervention .  The patient's history has been reviewed, patient examined, no change in status, stable for surgery.  I have reviewed the patient's chart and labs.  Questions were answered to the patient's satisfaction.     Frimy Uffelman D

## 2015-06-07 NOTE — Anesthesia Procedure Notes (Signed)
Procedure Name: LMA Insertion Date/Time: 06/07/2015 12:48 PM Performed by: Gar GibbonKEETON, Jeremiah [name] Pre-anesthesia Checklist: Patient identified, Emergency Drugs available, Suction available and Patient being monitored Patient Re-evaluated:Patient Re-evaluated prior to inductionOxygen Delivery Method: Circle System Utilized Preoxygenation: Pre-oxygenation with 100% oxygen Intubation Type: IV induction Ventilation: Mask ventilation without difficulty LMA: LMA inserted LMA Size: 4.0 Number of attempts: 1 Airway Equipment and Method: Bite block Placement Confirmation: positive ETCO2 Tube secured with: Tape Dental Injury: Teeth and Oropharynx as per pre-operative assessment

## 2015-06-07 NOTE — Op Note (Signed)
06/07/2015  2:00 PM  PATIENT:  Jeremiah [name]    PRE-OPERATIVE DIAGNOSIS:  OTHER MECHANICAL COMPLICATION OF OTHER INTERNAL OTHROPEDIC DEVICES IMPLANTS AND GRAFTS INITIAL ENCOUNTER DISPLACED FRACTURE OF LEFT TIBIAL SPINE   POST-OPERATIVE DIAGNOSIS:  Same  PROCEDURE:  LEFT TIBIAL SHAFT HARDWARE REMOVAL WITH ALLOGRAFT BONE AND INFUSE GRAFTS  SURGEON:  Hadassah Rana, Jewel BaizeIMOTHY D, MD  ASSISTANT: Janalee DaneBrittney Kelly, PA-C, She was present and scrubbed throughout the case, critical for completion in a timely fashion, and for retraction, instrumentation, and closure.   ANESTHESIA:   gen  PREOPERATIVE INDICATIONS:  Jeremiah [name] is a  19 y.o. male with a diagnosis of OTHER MECHANICAL COMPLICATION OF OTHER INTERNAL OTHROPEDIC DEVICES IMPLANTS AND GRAFTS INITIAL ENCOUNTER DISPLACED FRACTURE OF LEFT TIBIAL SPINE  who failed conservative measures and elected for surgical management.    The risks benefits and alternatives were discussed with the patient preoperatively including but not limited to the risks of infection, bleeding, nerve injury, cardiopulmonary complications, the need for revision surgery, among others, and the patient was willing to proceed.  OPERATIVE IMPLANTS: Infuse and cancellous bone chips  OPERATIVE FINDINGS: clean wound  BLOOD LOSS: min  COMPLICATIONS: none  TOURNIQUET TIME: 30min  OPERATIVE PROCEDURE:  Patient was identified in the preoperative holding area and site was marked by me He was transported to the operating theater and placed on the table in supine position taking care to pad all bony prominences. After a preincinduction time out anesthesia was induced. The left lower extremity was prepped and draped in normal sterile fashion and a pre-incision timeout was performed. He received ancef for preoperative antibiotics.   My incision away from his nearly healed abrasions.  I made a 7 cm incision centered over his bone defect.  I dissected down to the  pseudocapsule and informed around the cement spacer. I then incised this longitudinally.  Had to use an osteotome to separate the cement spacer it did come out hole and I confirm this with x-rays.  Next I mixed the infuse and made 2 rolls of infuse with cancellus bone chips within this I placed one on either side of the nail and the bone defect. I then completely filled the hole with the remaining cancellus chips. Prior to placing anything that I did do a thorough irrigation with a liter of fluid.  I then closed the pseudocapsule with a Vicryl stitch and close the skin with a nylon stitch. Sterile dressing was applied he is taking the PACU in stable condition.  POST OPERATIVE PLAN: WBAT in boot, mobilize for dvt px.     This note was generated using a template and dragon dictation system. In light of that, I have reviewed the note and all aspects of it are applicable to this case. Any dictation errors are due to the computerized dictation system.

## 2015-06-07 NOTE — Anesthesia Preprocedure Evaluation (Addendum)
Anesthesia Evaluation  Patient identified by MRN, date of birth, ID band Patient awake    Reviewed: Allergy & Precautions, H&P , NPO status , Patient's Chart, lab work & pertinent test results  Airway Mallampati: II  TM Distance: >3 FB Neck ROM: Full    Dental no notable dental hx. (+) Teeth Intact, Dental Advisory Given   Pulmonary neg pulmonary ROS,    Pulmonary exam normal breath sounds clear to auscultation       Cardiovascular negative cardio ROS   Rhythm:Regular Rate:Normal     Neuro/Psych negative neurological ROS  negative psych ROS   GI/Hepatic negative GI ROS, Neg liver ROS,   Endo/Other  negative endocrine ROS  Renal/GU negative Renal ROS  negative genitourinary   Musculoskeletal   Abdominal   Peds  Hematology negative hematology ROS (+)   Anesthesia Other Findings Interpreter was used for the interview  Reproductive/Obstetrics negative OB ROS                           Anesthesia Physical  Anesthesia Plan  ASA: II  Anesthesia Plan: General   Post-op Pain Management:    Induction: Intravenous  Airway Management Planned: LMA  Additional Equipment:   Intra-op Plan:   Post-operative Plan: Extubation in OR  Informed Consent: I have reviewed the patients History and Physical, chart, labs and discussed the procedure including the risks, benefits and alternatives for the proposed anesthesia with the patient or authorized representative who has indicated his/her understanding and acceptance.   Dental advisory given  Plan Discussed with: CRNA and Anesthesiologist  Anesthesia Plan Comments:         Anesthesia Quick Evaluation

## 2015-06-07 NOTE — Discharge Instructions (Signed)
Continue wet to dry dressings on the wound area daily.  Boot at all times  Will be seen back in the clinic in 1 weeks for recheck   Post Anesthesia Home Care Instructions  Activity: Get plenty of rest for the remainder of the day. A responsible adult should stay with you for 24 hours following the procedure.  For the next 24 hours, DO NOT: -Drive a car -Advertising copywriterperate machinery -Drink alcoholic beverages -Take any medication unless instructed by your physician -Make any legal decisions or sign important papers.  Meals: Start with liquid foods such as gelatin or soup. Progress to regular foods as tolerated. Avoid greasy, spicy, heavy foods. If nausea and/or vomiting occur, drink only clear liquids until the nausea and/or vomiting subsides. Call your physician if vomiting continues.  Special Instructions/Symptoms: Your throat may feel dry or sore from the anesthesia or the breathing tube placed in your throat during surgery. If this causes discomfort, gargle with warm salt water. The discomfort should disappear within 24 hours.  If you had a scopolamine patch placed behind your ear for the management of post- operative nausea and/or vomiting:  1. The medication in the patch is effective for 72 hours, after which it should be removed.  Wrap patch in a tissue and discard in the trash. Wash hands thoroughly with soap and water. 2. You may remove the patch earlier than 72 hours if you experience unpleasant side effects which may include dry mouth, dizziness or visual disturbances. 3. Avoid touching the patch. Wash your hands with soap and water after contact with the patch.

## 2015-06-07 NOTE — Progress Notes (Signed)
Used Swahili Translator Jeremiah [name] in [facility] for translation along with patient's brother who speaks [place].

## 2015-06-07 NOTE — Anesthesia Postprocedure Evaluation (Signed)
Anesthesia Post Note  Patient: Jeremiah [name]  Procedure(s) Performed: Procedure(s) (LRB): LEFT TIBIAL SHAFT HARDWARE REMOVAL WITH ALLOGRAFT BONE AND INFUSE GRAFTS (Left)  Patient location during evaluation: PACU Anesthesia Type: General Level of consciousness: sedated Pain management: pain level controlled Vital Signs Assessment: post-procedure vital signs reviewed and stable Respiratory status: spontaneous breathing and respiratory function stable Cardiovascular status: stable Anesthetic complications: no    Last Vitals:  Filed Vitals:   06/07/15 1440 06/07/15 1445  BP:    Pulse: 73 69  Temp:    Resp: 12 11    Last Pain:  Filed Vitals:   06/07/15 1446  PainSc: 0-No pain                 Wood Novacek DANIEL

## 2015-06-08 DIAGNOSIS — T84197A Other mechanical complication of internal fixation device of bone of left lower leg, initial encounter: Secondary | ICD-10-CM | POA: Diagnosis not present

## 2015-06-08 MED ORDER — CEFAZOLIN SODIUM 1-5 GM-% IV SOLN
INTRAVENOUS | Status: AC
  Filled 2015-06-08: qty 50

## 2015-06-10 ENCOUNTER — Encounter (HOSPITAL_BASED_OUTPATIENT_CLINIC_OR_DEPARTMENT_OTHER): Payer: Self-pay | Admitting: Orthopedic Surgery

## 2015-06-20 ENCOUNTER — Ambulatory Visit: Payer: Medicaid Other | Attending: Orthopedic Surgery | Admitting: Physical Therapy

## 2015-06-20 DIAGNOSIS — R29898 Other symptoms and signs involving the musculoskeletal system: Secondary | ICD-10-CM | POA: Insufficient documentation

## 2015-06-20 DIAGNOSIS — M25562 Pain in left knee: Secondary | ICD-10-CM | POA: Insufficient documentation

## 2015-06-20 DIAGNOSIS — Z967 Presence of other bone and tendon implants: Secondary | ICD-10-CM | POA: Diagnosis present

## 2015-06-20 DIAGNOSIS — Z8781 Personal history of (healed) traumatic fracture: Secondary | ICD-10-CM | POA: Diagnosis present

## 2015-06-20 DIAGNOSIS — Z9889 Other specified postprocedural states: Secondary | ICD-10-CM

## 2015-06-20 DIAGNOSIS — M25669 Stiffness of unspecified knee, not elsewhere classified: Secondary | ICD-10-CM

## 2015-06-20 DIAGNOSIS — R531 Weakness: Secondary | ICD-10-CM

## 2015-06-20 DIAGNOSIS — M6281 Muscle weakness (generalized): Secondary | ICD-10-CM | POA: Insufficient documentation

## 2015-06-20 DIAGNOSIS — R262 Difficulty in walking, not elsewhere classified: Secondary | ICD-10-CM | POA: Insufficient documentation

## 2015-06-20 NOTE — Patient Instructions (Signed)
Cryotherapy Cryotherapy means treatment with cold. Ice or gel packs can be used to reduce both pain and swelling. Ice is the most helpful within the first 24 to 48 hours after an injury or flare-up from overusing a muscle or joint. Sprains, strains, spasms, burning pain, shooting pain, and aches can all be eased with ice. Ice can also be used when recovering from surgery. Ice is effective, has very few side effects, and is safe for most people to use. PRECAUTIONS  Ice is not a safe treatment option for people with:  Raynaud phenomenon. This is a condition affecting small blood vessels in the extremities. Exposure to cold may cause your problems to return.  Cold hypersensitivity. There are many forms of cold hypersensitivity, including:  Cold urticaria. Red, itchy hives appear on the skin when the tissues begin to warm after being iced.  Cold erythema. This is a red, itchy rash caused by exposure to cold.  Cold hemoglobinuria. Red blood cells break down when the tissues begin to warm after being iced. The hemoglobin that carry oxygen are passed into the urine because they cannot combine with blood proteins fast enough.  Numbness or altered sensitivity in the area being iced. If you have any of the following conditions, do not use ice until you have discussed cryotherapy with your caregiver:  Heart conditions, such as arrhythmia, angina, or chronic heart disease.  High blood pressure.  Healing wounds or open skin in the area being iced.  Current infections.  Rheumatoid arthritis.  Poor circulation.  Diabetes. Ice slows the blood flow in the region it is applied. This is beneficial when trying to stop inflamed tissues from spreading irritating chemicals to surrounding tissues. However, if you expose your skin to cold temperatures for too long or without the proper protection, you can damage your skin or nerves. Watch for signs of skin damage due to cold. HOME CARE INSTRUCTIONS Follow  these tips to use ice and cold packs safely.  Place a dry or damp towel between the ice and skin. A damp towel will cool the skin more quickly, so you may need to shorten the time that the ice is used.  For a more rapid response, add gentle compression to the ice.  Ice for no more than 10 to 20 minutes at a time. The bonier the area you are icing, the less time it will take to get the benefits of ice.  Check your skin after 5 minutes to make sure there are no signs of a poor response to cold or skin damage.  Rest 20 minutes or more between uses.  Once your skin is numb, you can end your treatment. You can test numbness by very lightly touching your skin. The touch should be so light that you do not see the skin dimple from the pressure of your fingertip. When using ice, most people will feel these normal sensations in this order: cold, burning, aching, and numbness.  Do not use ice on someone who cannot communicate their responses to pain, such as small children or people with dementia. HOW TO MAKE AN ICE PACK Ice packs are the most common way to use ice therapy. Other methods include ice massage, ice baths, and cryosprays. Muscle creams that cause a cold, tingly feeling do not offer the same benefits that ice offers and should not be used as a substitute unless recommended by your caregiver. To make an ice pack, do one of the following:  Place crushed ice or a  bag of frozen vegetables in a sealable plastic bag. Squeeze out the excess air. Place this bag inside another plastic bag. Slide the bag into a pillowcase or place a damp towel between your skin and the bag.  Mix 3 parts water with 1 part rubbing alcohol. Freeze the mixture in a sealable plastic bag. When you remove the mixture from the freezer, it will be slushy. Squeeze out the excess air. Place this bag inside another plastic bag. Slide the bag into a pillowcase or place a damp towel between your skin and the bag. SEEK MEDICAL CARE  IF:  You develop white spots on your skin. This may give the skin a blotchy (mottled) appearance.  Your skin turns blue or pale.  Your skin becomes waxy or hard.  Your swelling gets worse. MAKE SURE YOU:   Understand these instructions.  Will watch your condition.  Will get help right away if you are not doing well or get worse. Document Released: 02/16/2011 Document Revised: 11/06/2013 Document Reviewed: 02/16/2011 [facility] Patient Information 2015 [employer], [place]. This information is not intended to replace advice given to you by your health care provider. Make sure you discuss any questions you have with your health care provider.  Left AROM and Dorsiflexion stretch given in handout  Garen LahLawrie Beardsley, PT 06/20/2015 9:33 AM Phone: [phone] Fax: [phone]
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
## 2015-07-03 ENCOUNTER — Encounter (HOSPITAL_COMMUNITY)
Admission: AD | Disposition: A | Discharge status: Home / Self Care | Payer: Self-pay | Source: Ambulatory Visit | Attending: Orthopedic Surgery

## 2015-07-03 ENCOUNTER — Other Ambulatory Visit: Payer: Self-pay | Admitting: Physician Assistant

## 2015-07-03 ENCOUNTER — Inpatient Hospital Stay (HOSPITAL_COMMUNITY): Payer: Medicaid Other | Admitting: Anesthesiology

## 2015-07-03 ENCOUNTER — Inpatient Hospital Stay (HOSPITAL_COMMUNITY)
Admission: AD | Admit: 2015-07-03 | Discharge: 2015-07-09 | DRG: 907 | Disposition: A | Discharge status: Home / Self Care | Payer: Medicaid Other | Source: Ambulatory Visit | Attending: Orthopedic Surgery | Admitting: Orthopedic Surgery

## 2015-07-03 ENCOUNTER — Encounter (HOSPITAL_COMMUNITY): Payer: Self-pay | Admitting: Physician Assistant

## 2015-07-03 DIAGNOSIS — J9601 Acute respiratory failure with hypoxia: Secondary | ICD-10-CM | POA: Diagnosis not present

## 2015-07-03 DIAGNOSIS — G43709 Chronic migraine without aura, not intractable, without status migrainosus: Secondary | ICD-10-CM | POA: Diagnosis present

## 2015-07-03 DIAGNOSIS — R6520 Severe sepsis without septic shock: Secondary | ICD-10-CM | POA: Diagnosis present

## 2015-07-03 DIAGNOSIS — S82402H Unspecified fracture of shaft of left fibula, subsequent encounter for open fracture type I or II with delayed healing: Secondary | ICD-10-CM

## 2015-07-03 DIAGNOSIS — L02416 Cutaneous abscess of left lower limb: Secondary | ICD-10-CM | POA: Diagnosis present

## 2015-07-03 DIAGNOSIS — A419 Sepsis, unspecified organism: Secondary | ICD-10-CM | POA: Diagnosis present

## 2015-07-03 DIAGNOSIS — S82202H Unspecified fracture of shaft of left tibia, subsequent encounter for open fracture type I or II with delayed healing: Secondary | ICD-10-CM

## 2015-07-03 DIAGNOSIS — D6489 Other specified anemias: Secondary | ICD-10-CM | POA: Diagnosis present

## 2015-07-03 DIAGNOSIS — B9689 Other specified bacterial agents as the cause of diseases classified elsewhere: Secondary | ICD-10-CM | POA: Diagnosis present

## 2015-07-03 DIAGNOSIS — M86162 Other acute osteomyelitis, left tibia and fibula: Secondary | ICD-10-CM | POA: Diagnosis not present

## 2015-07-03 DIAGNOSIS — J385 Laryngeal spasm: Secondary | ICD-10-CM | POA: Diagnosis not present

## 2015-07-03 DIAGNOSIS — T84623D Infection and inflammatory reaction due to internal fixation device of left tibia, subsequent encounter: Secondary | ICD-10-CM | POA: Diagnosis not present

## 2015-07-03 DIAGNOSIS — T84623A Infection and inflammatory reaction due to internal fixation device of left tibia, initial encounter: Secondary | ICD-10-CM | POA: Diagnosis not present

## 2015-07-03 DIAGNOSIS — R451 Restlessness and agitation: Secondary | ICD-10-CM | POA: Diagnosis not present

## 2015-07-03 DIAGNOSIS — Z8673 Personal history of transient ischemic attack (TIA), and cerebral infarction without residual deficits: Secondary | ICD-10-CM

## 2015-07-03 DIAGNOSIS — T86832 Bone graft infection: Principal | ICD-10-CM | POA: Diagnosis present

## 2015-07-03 DIAGNOSIS — Y838 Other surgical procedures as the cause of abnormal reaction of the patient, or of later complication, without mention of misadventure at the time of the procedure: Secondary | ICD-10-CM | POA: Diagnosis present

## 2015-07-03 DIAGNOSIS — S82209B Unspecified fracture of shaft of unspecified tibia, initial encounter for open fracture type I or II: Secondary | ICD-10-CM | POA: Diagnosis present

## 2015-07-03 DIAGNOSIS — J810 Acute pulmonary edema: Secondary | ICD-10-CM | POA: Diagnosis not present

## 2015-07-03 DIAGNOSIS — Z9911 Dependence on respirator [ventilator] status: Secondary | ICD-10-CM

## 2015-07-03 DIAGNOSIS — J9589 Other postprocedural complications and disorders of respiratory system, not elsewhere classified: Secondary | ICD-10-CM | POA: Diagnosis not present

## 2015-07-03 DIAGNOSIS — Z9289 Personal history of other medical treatment: Secondary | ICD-10-CM

## 2015-07-03 DIAGNOSIS — J80 Acute respiratory distress syndrome: Secondary | ICD-10-CM | POA: Diagnosis not present

## 2015-07-03 DIAGNOSIS — J9690 Respiratory failure, unspecified, unspecified whether with hypoxia or hypercapnia: Secondary | ICD-10-CM

## 2015-07-03 DIAGNOSIS — S82409B Unspecified fracture of shaft of unspecified fibula, initial encounter for open fracture type I or II: Secondary | ICD-10-CM

## 2015-07-03 DIAGNOSIS — Z4659 Encounter for fitting and adjustment of other gastrointestinal appliance and device: Secondary | ICD-10-CM

## 2015-07-03 HISTORY — DX: Severe sepsis without septic shock: R65.20

## 2015-07-03 HISTORY — PX: I & D EXTREMITY: SHX5045

## 2015-07-03 HISTORY — DX: Sepsis, unspecified organism: A41.9

## 2015-07-03 HISTORY — DX: Infection and inflammatory reaction due to internal fixation device of left tibia, initial encounter: T84.623A

## 2015-07-03 LAB — COMPREHENSIVE METABOLIC PANEL
ALK PHOS: 91 U/L (ref 38–126)
ALT: 13 U/L — ABNORMAL LOW (ref 17–63)
ANION GAP: 12 (ref 5–15)
AST: 18 U/L (ref 15–41)
Albumin: 3.9 g/dL (ref 3.5–5.0)
BILIRUBIN TOTAL: 0.8 mg/dL (ref 0.3–1.2)
BUN: 10 mg/dL (ref 6–20)
CALCIUM: 9.7 mg/dL (ref 8.9–10.3)
CO2: 26 mmol/L (ref 22–32)
Chloride: 99 mmol/L — ABNORMAL LOW (ref 101–111)
Creatinine, Ser: 0.91 mg/dL (ref 0.61–1.24)
Glucose, Bld: 87 mg/dL (ref 65–99)
Potassium: 3.9 mmol/L (ref 3.5–5.1)
SODIUM: 137 mmol/L (ref 135–145)
TOTAL PROTEIN: 7.8 g/dL (ref 6.5–8.1)

## 2015-07-03 LAB — CBC WITH DIFFERENTIAL/PLATELET
Basophils Absolute: 0 10*3/uL (ref 0.0–0.1)
Basophils Relative: 0 %
EOS ABS: 0 10*3/uL (ref 0.0–0.7)
Eosinophils Relative: 0 %
HCT: 28.1 % — ABNORMAL LOW (ref 39.0–52.0)
HEMOGLOBIN: 8.2 g/dL — AB (ref 13.0–17.0)
LYMPHS ABS: 1.6 10*3/uL (ref 0.7–4.0)
Lymphocytes Relative: 16 %
MCH: 19.6 pg — AB (ref 26.0–34.0)
MCHC: 29.2 g/dL — AB (ref 30.0–36.0)
MCV: 67.2 fL — ABNORMAL LOW (ref 78.0–100.0)
MONOS PCT: 13 %
Monocytes Absolute: 1.3 10*3/uL — ABNORMAL HIGH (ref 0.1–1.0)
NEUTROS PCT: 70 %
Neutro Abs: 7 10*3/uL (ref 1.7–7.7)
Platelets: 359 10*3/uL (ref 150–400)
RBC: 4.18 MIL/uL — ABNORMAL LOW (ref 4.22–5.81)
RDW: 16.6 % — ABNORMAL HIGH (ref 11.5–15.5)
WBC: 10 10*3/uL (ref 4.0–10.5)

## 2015-07-03 SURGERY — IRRIGATION AND DEBRIDEMENT EXTREMITY
Anesthesia: General | Site: Leg Lower | Laterality: Left

## 2015-07-03 MED ORDER — MIDAZOLAM HCL 5 MG/5ML IJ SOLN
INTRAMUSCULAR | Status: DC | PRN
  Administered 2015-07-03 (×2): 2 mg via INTRAVENOUS

## 2015-07-03 MED ORDER — TOBRAMYCIN SULFATE 1.2 G IJ SOLR
1.2000 g | INTRAMUSCULAR | Status: AC
  Filled 2015-07-03: qty 1.2

## 2015-07-03 MED ORDER — PROPOFOL 10 MG/ML IV BOLUS
INTRAVENOUS | Status: AC
  Filled 2015-07-03: qty 20

## 2015-07-03 MED ORDER — VANCOMYCIN HCL IN DEXTROSE 1-5 GM/200ML-% IV SOLN
1000.0000 mg | Freq: Once | INTRAVENOUS | Status: AC
  Administered 2015-07-03: 1000 mg via INTRAVENOUS
  Filled 2015-07-03: qty 200

## 2015-07-03 MED ORDER — LIDOCAINE HCL (CARDIAC) 20 MG/ML IV SOLN
INTRAVENOUS | Status: DC | PRN
  Administered 2015-07-03: 75 mg via INTRAVENOUS

## 2015-07-03 MED ORDER — LACTATED RINGERS IV SOLN
INTRAVENOUS | Status: DC | PRN
  Administered 2015-07-03 (×2): via INTRAVENOUS

## 2015-07-03 MED ORDER — FENTANYL CITRATE (PF) 250 MCG/5ML IJ SOLN
INTRAMUSCULAR | Status: AC
  Filled 2015-07-03: qty 5

## 2015-07-03 MED ORDER — DEXAMETHASONE SODIUM PHOSPHATE 4 MG/ML IJ SOLN
INTRAMUSCULAR | Status: AC
  Filled 2015-07-03: qty 2

## 2015-07-03 MED ORDER — MIDAZOLAM HCL 2 MG/2ML IJ SOLN
INTRAMUSCULAR | Status: AC
  Filled 2015-07-03: qty 2

## 2015-07-03 MED ORDER — HYDROMORPHONE HCL 1 MG/ML IJ SOLN
1.0000 mg | INTRAMUSCULAR | Status: DC | PRN
  Administered 2015-07-03: 1 mg via INTRAVENOUS
  Filled 2015-07-03: qty 1

## 2015-07-03 MED ORDER — IBUPROFEN 600 MG PO TABS: 600.0000 mg | ORAL_TABLET | Freq: Four times a day (QID) | ORAL | Status: DC | PRN

## 2015-07-03 MED ORDER — TOBRAMYCIN SULFATE 1.2 G IJ SOLR
INTRAMUSCULAR | Status: DC | PRN
  Administered 2015-07-03: 1.2 g

## 2015-07-03 MED ORDER — VANCOMYCIN HCL 500 MG IV SOLR
INTRAVENOUS | Status: DC | PRN
  Administered 2015-07-03: 500 mg

## 2015-07-03 MED ORDER — PROPOFOL 10 MG/ML IV BOLUS
INTRAVENOUS | Status: DC | PRN
  Administered 2015-07-03: 200 mg via INTRAVENOUS

## 2015-07-03 MED ORDER — DEXAMETHASONE SODIUM PHOSPHATE 4 MG/ML IJ SOLN
INTRAMUSCULAR | Status: DC | PRN
  Administered 2015-07-03: 8 mg via INTRAVENOUS

## 2015-07-03 MED ORDER — PIPERACILLIN-TAZOBACTAM 3.375 G IVPB
3.3750 g | Freq: Three times a day (TID) | INTRAVENOUS | Status: DC
  Administered 2015-07-03 – 2015-07-08 (×15): 3.375 g via INTRAVENOUS
  Filled 2015-07-03 (×21): qty 50

## 2015-07-03 MED ORDER — VANCOMYCIN HCL IN DEXTROSE 750-5 MG/150ML-% IV SOLN
750.0000 mg | Freq: Three times a day (TID) | INTRAVENOUS | Status: DC
  Administered 2015-07-04 – 2015-07-06 (×6): 750 mg via INTRAVENOUS
  Filled 2015-07-03 (×10): qty 150

## 2015-07-03 MED ORDER — FENTANYL CITRATE (PF) 100 MCG/2ML IJ SOLN
INTRAMUSCULAR | Status: DC | PRN
  Administered 2015-07-03 (×2): 100 ug via INTRAVENOUS
  Administered 2015-07-03: 50 ug via INTRAVENOUS
  Administered 2015-07-03: 100 ug via INTRAVENOUS

## 2015-07-03 MED ORDER — VANCOMYCIN HCL 500 MG IV SOLR
500.0000 mg | INTRAVENOUS | Status: AC
  Filled 2015-07-03: qty 500

## 2015-07-03 MED ORDER — ACETAMINOPHEN 325 MG PO TABS: 650.0000 mg | ORAL_TABLET | ORAL | Status: DC | PRN

## 2015-07-03 MED ORDER — LIDOCAINE HCL (CARDIAC) 20 MG/ML IV SOLN
INTRAVENOUS | Status: AC
  Filled 2015-07-03: qty 5

## 2015-07-03 MED ORDER — SUCCINYLCHOLINE CHLORIDE 20 MG/ML IJ SOLN
INTRAMUSCULAR | Status: AC
  Filled 2015-07-03: qty 1

## 2015-07-03 MED ORDER — ONDANSETRON HCL 4 MG/2ML IJ SOLN
INTRAMUSCULAR | Status: DC | PRN
  Administered 2015-07-03: 4 mg via INTRAVENOUS

## 2015-07-03 MED ORDER — ROCURONIUM BROMIDE 100 MG/10ML IV SOLN
INTRAVENOUS | Status: DC | PRN
  Administered 2015-07-03: 25 mg via INTRAVENOUS

## 2015-07-03 MED ORDER — POTASSIUM CHLORIDE IN NACL 20-0.9 MEQ/L-% IV SOLN
INTRAVENOUS | Status: DC
  Administered 2015-07-03: 20:00:00 via INTRAVENOUS
  Filled 2015-07-03 (×2): qty 1000

## 2015-07-03 MED ORDER — SODIUM CHLORIDE 0.9 % IR SOLN
Status: DC | PRN
  Administered 2015-07-03: 5000 mL

## 2015-07-03 MED ORDER — ONDANSETRON HCL 4 MG/2ML IJ SOLN
INTRAMUSCULAR | Status: AC
  Filled 2015-07-03: qty 2

## 2015-07-03 SURGICAL SUPPLY — 47 items
BANDAGE ELASTIC 4 VELCRO ST LF (GAUZE/BANDAGES/DRESSINGS) ×2 IMPLANT
BANDAGE ELASTIC 6 VELCRO ST LF (GAUZE/BANDAGES/DRESSINGS) ×2 IMPLANT
BLADE SURG 10 STRL SS (BLADE) ×2 IMPLANT
BNDG COHESIVE 4X5 TAN STRL (GAUZE/BANDAGES/DRESSINGS) ×2 IMPLANT
BNDG GAUZE ELAST 4 BULKY (GAUZE/BANDAGES/DRESSINGS) ×2 IMPLANT
COVER SURGICAL LIGHT HANDLE (MISCELLANEOUS) ×2 IMPLANT
CUFF TOURNIQUET SINGLE 34IN LL (TOURNIQUET CUFF) IMPLANT
DRSG ADAPTIC 3X8 NADH LF (GAUZE/BANDAGES/DRESSINGS) ×2 IMPLANT
DURAPREP 26ML APPLICATOR (WOUND CARE) ×2 IMPLANT
ELECT REM PT RETURN 9FT ADLT (ELECTROSURGICAL)
ELECTRODE REM PT RTRN 9FT ADLT (ELECTROSURGICAL) IMPLANT
EVACUATOR 1/8 PVC DRAIN (DRAIN) IMPLANT
FACESHIELD WRAPAROUND (MASK) ×6 IMPLANT
GAUZE SPONGE 4X4 12PLY STRL (GAUZE/BANDAGES/DRESSINGS) ×2 IMPLANT
GAUZE SPONGE 4X4 16PLY XRAY LF (GAUZE/BANDAGES/DRESSINGS) ×2 IMPLANT
GAUZE XEROFORM 1X8 LF (GAUZE/BANDAGES/DRESSINGS) ×2 IMPLANT
GLOVE BIO SURGEON STRL SZ 6 (GLOVE) ×4 IMPLANT
GLOVE BIO SURGEON STRL SZ 6.5 (GLOVE) ×4 IMPLANT
GLOVE BIO SURGEON STRL SZ7 (GLOVE) ×4 IMPLANT
GLOVE BIO SURGEON STRL SZ7.5 (GLOVE) ×2 IMPLANT
GLOVE BIOGEL PI IND STRL 7.0 (GLOVE) ×1 IMPLANT
GLOVE BIOGEL PI INDICATOR 7.0 (GLOVE) ×1
GOWN STRL REUS W/ TWL LRG LVL3 (GOWN DISPOSABLE) ×2 IMPLANT
GOWN STRL REUS W/TWL LRG LVL3 (GOWN DISPOSABLE) ×2
HANDPIECE INTERPULSE COAX TIP (DISPOSABLE)
KIT BASIN OR (CUSTOM PROCEDURE TRAY) ×2 IMPLANT
KIT ROOM TURNOVER OR (KITS) ×2 IMPLANT
KIT STIMULAN RAPID CURE  10CC (Orthopedic Implant) ×1 IMPLANT
KIT STIMULAN RAPID CURE 10CC (Orthopedic Implant) ×1 IMPLANT
MANIFOLD NEPTUNE II (INSTRUMENTS) ×2 IMPLANT
NS IRRIG 1000ML POUR BTL (IV SOLUTION) ×2 IMPLANT
PACK ORTHO EXTREMITY (CUSTOM PROCEDURE TRAY) ×2 IMPLANT
PAD ARMBOARD 7.5X6 YLW CONV (MISCELLANEOUS) ×4 IMPLANT
PENCIL BUTTON HOLSTER BLD 10FT (ELECTRODE) IMPLANT
SET CYSTO W/LG BORE CLAMP LF (SET/KITS/TRAYS/PACK) ×2 IMPLANT
SET HNDPC FAN SPRY TIP SCT (DISPOSABLE) IMPLANT
SPONGE LAP 18X18 X RAY DECT (DISPOSABLE) ×2 IMPLANT
STOCKINETTE IMPERVIOUS 9X36 MD (GAUZE/BANDAGES/DRESSINGS) ×2 IMPLANT
SUT ETHILON 3 0 PS 1 (SUTURE) ×4 IMPLANT
TOWEL OR 17X24 6PK STRL BLUE (TOWEL DISPOSABLE) ×2 IMPLANT
TOWEL OR 17X26 10 PK STRL BLUE (TOWEL DISPOSABLE) ×2 IMPLANT
TOWEL OR NON WOVEN STRL DISP B (DISPOSABLE) ×2 IMPLANT
TUBE ANAEROBIC SPECIMEN COL (MISCELLANEOUS) IMPLANT
TUBE CONNECTING 12X1/4 (SUCTIONS) ×2 IMPLANT
UNDERPAD 30X30 INCONTINENT (UNDERPADS AND DIAPERS) ×2 IMPLANT
WATER STERILE IRR 1000ML POUR (IV SOLUTION) ×2 IMPLANT
YANKAUER SUCT BULB TIP NO VENT (SUCTIONS) ×2 IMPLANT

## 2015-07-03 NOTE — Anesthesia Procedure Notes (Signed)
Procedure Name: LMA Insertion Date/Time: 07/03/2015 9:28 PM Performed by: Lenardo Westwood S Pre-anesthesia Checklist: Patient identified, Timeout performed, Emergency Drugs available, Suction available and Patient being monitored Patient Re-evaluated:Patient Re-evaluated prior to inductionOxygen Delivery Method: Circle system utilized Preoxygenation: Pre-oxygenation with 100% oxygen Intubation Type: IV induction Ventilation: Mask ventilation without difficulty LMA: LMA inserted LMA Size: 4.0 Number of attempts: 1 Placement Confirmation: breath sounds checked- equal and bilateral and positive ETCO2 Tube secured with: Tape Dental Injury: Teeth and Oropharynx as per pre-operative assessment

## 2015-07-03 NOTE — Anesthesia Preprocedure Evaluation (Signed)
Anesthesia Evaluation  Patient identified by MRN, date of birth, ID band Patient awake    Reviewed: Allergy & Precautions, NPO status , Patient's Chart, lab work & pertinent test results  Airway Mallampati: I  TM Distance: >3 FB Neck ROM: Full    Dental  (+) Teeth Intact   Pulmonary neg pulmonary ROS,    breath sounds clear to auscultation       Cardiovascular negative cardio ROS   Rhythm:Regular Rate:Tachycardia     Neuro/Psych negative neurological ROS  negative psych ROS   GI/Hepatic negative GI ROS, Neg liver ROS,   Endo/Other  negative endocrine ROS  Renal/GU negative Renal ROS  negative genitourinary   Musculoskeletal negative musculoskeletal ROS (+)   Abdominal   Peds negative pediatric ROS (+)  Hematology negative hematology ROS (+)   Anesthesia Other Findings   Reproductive/Obstetrics negative OB ROS                             Anesthesia Physical Anesthesia Plan  ASA: I and emergent  Anesthesia Plan: General   Post-op Pain Management:    Induction: Intravenous  Airway Management Planned: LMA  Additional Equipment:   Intra-op Plan:   Post-operative Plan: Extubation in OR  Informed Consent: I have reviewed the patients History and Physical, chart, labs and discussed the procedure including the risks, benefits and alternatives for the proposed anesthesia with the patient or authorized representative who has indicated his/her understanding and acceptance.   Dental advisory given  Plan Discussed with: CRNA and Surgeon  Anesthesia Plan Comments:         Anesthesia Quick Evaluation
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
## 2015-07-03 NOTE — Transfer of Care (Signed)
Immediate Anesthesia Transfer of Care Note  Patient: Jeremiah [name]  Procedure(s) Performed: Procedure(s): IRRIGATION AND DEBRIDEMENT EXTREMITY (Left)  Patient Location: SICU  Anesthesia Type:General  Level of Consciousness: Patient remains intubated per anesthesia plan  Airway & Oxygen Therapy: Patient remains intubated per anesthesia plan and Patient placed on Ventilator (see vital sign flow sheet for setting)  Post-op Assessment: Report given to RN and Post -op Vital signs reviewed and stable  Post vital signs: Reviewed and stable  Last Vitals:  Filed Vitals:   07/03/15 1915  BP: 100/48  Pulse: 109  Temp: 36.7 C  Resp: 16    Complications: Patient re-intubated and laryngospasm with obstructive pul. edema

## 2015-07-03 NOTE — Op Note (Signed)
07/03/2015  10:52 PM  PATIENT:  Jeremiah [name]    PRE-OPERATIVE DIAGNOSIS:  Left Leg Infection  POST-OPERATIVE DIAGNOSIS:  Same  PROCEDURE:  IRRIGATION AND DEBRIDEMENT EXTREMITY  SURGEON:  Samreen Seltzer D, MD  ASSISTANT: none  ANESTHESIA:   gen  PREOPERATIVE INDICATIONS:  Jeremiah [name] is a  19 y.o. male with a diagnosis of Left Leg Infection who failed conservative measures and elected for surgical management.    The risks benefits and alternatives were discussed with the patient preoperatively including but not limited to the risks of infection, bleeding, nerve injury, cardiopulmonary complications, the need for revision surgery, among others, and the patient was willing to proceed.  OPERATIVE IMPLANTS: antibiotic beeds.  OPERATIVE FINDINGS: purulent fluid surrounding bone grafted region.  BLOOD LOSS: 30cc  COMPLICATIONS: none  TOURNIQUET TIME: none  OPERATIVE PROCEDURE:  Patient was identified in the preoperative holding area and site was marked by me He was transported to the operating theater and placed on the table in supine position taking care to pad all bony prominences. After a preincinduction time out anesthesia was induced. The left lower extremity was prepped and draped in normal sterile fashion and a pre-incision timeout was performed. He received no preoperative antibiotics for planned intra-operative culture.   I made a 5 cm incision through his previous bone grafting incision and immediately express purulent fluid. This was collected and a culture cup and sent to the lab for culture.  I then opened the remaining area of his bone graft cavity. Purulent fluid did come from this cavity and was confluent with the bone graft. It had eroded superficially into the anterior skin but had not auto drained.  As the bone graft was made with purulent fluid I did remove the bone graft from his wound. I thoroughly irrigated this with 4 L of saline I confirmed  all removal of all nonvital S tissue.  I perform this debridement with a knife and scissors Cobb and curet.  After thorough irrigation I then placed antibiotic beads mixed with gentamicin and vancomycin powder into his wound. I then closed the skin with simple nylon stitches closing it loosely to allow for drainage as needed.  I placed a sterile dressing he was awoken and taken the PACU in stable condition.  POST OPERATIVE PLAN: Weightbearing as tolerated I'll place him on Lovenox he will continue on bank and Zosyn we'll obtain an ID consult and wait for culture results.    This note was generated using a template and dragon dictation system. In light of that, I have reviewed the note and all aspects of it are applicable to this case. Any dictation errors are due to the computerized dictation system.

## 2015-07-03 NOTE — Progress Notes (Signed)
Intraop at end of surgery/anesthesia patient developed what is likely postobstructive pulmonary edema after removal of LMA . Mask ventilation was difficult. Blood tinged sputum was noted. He was reintubated w resultant copious amounts of blood tinged pulmonary fluid noted within ETT. Suctioned immediately and thereafter every minute for 30 minutes, Patient further sedated and rocuronium 25 mg given for muscle relaxation. On 100% FiO2 throughout. Plan transport to ICU and overnight ventilation at minimum.
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
## 2015-07-04 ENCOUNTER — Inpatient Hospital Stay (HOSPITAL_COMMUNITY): Payer: Medicaid Other

## 2015-07-04 ENCOUNTER — Encounter (HOSPITAL_COMMUNITY): Payer: Self-pay | Admitting: Orthopedic Surgery

## 2015-07-04 DIAGNOSIS — R6520 Severe sepsis without septic shock: Secondary | ICD-10-CM

## 2015-07-04 DIAGNOSIS — J80 Acute respiratory distress syndrome: Secondary | ICD-10-CM

## 2015-07-04 DIAGNOSIS — J9601 Acute respiratory failure with hypoxia: Secondary | ICD-10-CM

## 2015-07-04 DIAGNOSIS — T84623D Infection and inflammatory reaction due to internal fixation device of left tibia, subsequent encounter: Secondary | ICD-10-CM

## 2015-07-04 DIAGNOSIS — A419 Sepsis, unspecified organism: Secondary | ICD-10-CM

## 2015-07-04 LAB — BASIC METABOLIC PANEL
Anion gap: 12 (ref 5–15)
BUN: 12 mg/dL (ref 6–20)
CHLORIDE: 99 mmol/L — AB (ref 101–111)
CO2: 26 mmol/L (ref 22–32)
CREATININE: 0.85 mg/dL (ref 0.61–1.24)
Calcium: 9.2 mg/dL (ref 8.9–10.3)
GFR calc non Af Amer: >60 mL/min (ref >60)
Glucose, Bld: 132 mg/dL — ABNORMAL HIGH (ref 65–99)
POTASSIUM: 4.4 mmol/L (ref 3.5–5.1)
Sodium: 137 mmol/L (ref 135–145)

## 2015-07-04 LAB — GLUCOSE, CAPILLARY: GLUCOSE-CAPILLARY: 134 mg/dL — AB (ref 65–99)

## 2015-07-04 LAB — POCT I-STAT 3, ART BLOOD GAS (G3+)
Acid-Base Excess: 1 mmol/L (ref 0.0–2.0)
Bicarbonate: 28 mEq/L — ABNORMAL HIGH (ref 20.0–24.0)
O2 Saturation: 98 %
PCO2 ART: 55.5 mmHg — AB (ref 35.0–45.0)
PH ART: 7.309 — AB (ref 7.350–7.450)
Patient temperature: 98.1
TCO2: 30 mmol/L (ref 0–100)
pO2, Arterial: 121 mmHg — ABNORMAL HIGH (ref 80.0–100.0)

## 2015-07-04 LAB — LACTIC ACID, PLASMA: Lactic Acid, Venous: 3 mmol/L (ref 0.5–2.0)

## 2015-07-04 LAB — TRIGLYCERIDES: Triglycerides: 90 mg/dL (ref <150)

## 2015-07-04 LAB — GRAM STAIN

## 2015-07-04 LAB — SURGICAL PCR SCREEN
MRSA, PCR: NEGATIVE
Staphylococcus aureus: POSITIVE — AB

## 2015-07-04 LAB — PHOSPHORUS: PHOSPHORUS: 5.6 mg/dL — AB (ref 2.5–4.6)

## 2015-07-04 LAB — MAGNESIUM: MAGNESIUM: 2 mg/dL (ref 1.7–2.4)

## 2015-07-04 MED ORDER — PRO-STAT SUGAR FREE PO LIQD
30.0000 mL | Freq: Two times a day (BID) | ORAL | Status: AC
  Administered 2015-07-04 (×2): 30 mL
  Filled 2015-07-04 (×2): qty 30

## 2015-07-04 MED ORDER — ACETAMINOPHEN 325 MG PO TABS
650.0000 mg | ORAL_TABLET | Freq: Four times a day (QID) | ORAL | Status: DC | PRN
  Administered 2015-07-06 – 2015-07-08 (×2): 650 mg via ORAL
  Filled 2015-07-04 (×2): qty 2

## 2015-07-04 MED ORDER — PANTOPRAZOLE SODIUM 40 MG IV SOLR
40.0000 mg | Freq: Every day | INTRAVENOUS | Status: DC
  Administered 2015-07-04 – 2015-07-05 (×2): 40 mg via INTRAVENOUS
  Filled 2015-07-04 (×3): qty 40

## 2015-07-04 MED ORDER — VITAL AF 1.2 CAL PO LIQD
1000.0000 mL | ORAL | Status: DC
  Administered 2015-07-04: 1000 mL
  Filled 2015-07-04 (×3): qty 1000

## 2015-07-04 MED ORDER — FENTANYL CITRATE (PF) 100 MCG/2ML IJ SOLN
100.0000 ug | INTRAMUSCULAR | Status: DC | PRN
  Administered 2015-07-04 – 2015-07-05 (×8): 100 ug via INTRAVENOUS
  Filled 2015-07-04 (×8): qty 2

## 2015-07-04 MED ORDER — ENOXAPARIN SODIUM 40 MG/0.4ML ~~LOC~~ SOLN
40.0000 mg | Freq: Every day | SUBCUTANEOUS | Status: DC
  Filled 2015-07-04: qty 0.4

## 2015-07-04 MED ORDER — PROPOFOL 1000 MG/100ML IV EMUL
0.0000 ug/kg/min | INTRAVENOUS | Status: DC
  Administered 2015-07-04 (×2): 35 ug/kg/min via INTRAVENOUS
  Administered 2015-07-04: 50 ug/kg/min via INTRAVENOUS
  Administered 2015-07-04: 30 ug/kg/min via INTRAVENOUS
  Administered 2015-07-05 (×2): 15 ug/kg/min via INTRAVENOUS
  Administered 2015-07-05: 35 ug/kg/min via INTRAVENOUS
  Filled 2015-07-04 (×5): qty 100

## 2015-07-04 MED ORDER — FENTANYL CITRATE (PF) 100 MCG/2ML IJ SOLN
100.0000 ug | Freq: Once | INTRAMUSCULAR | Status: AC
  Administered 2015-07-04: 100 ug via INTRAVENOUS

## 2015-07-04 MED ORDER — FENTANYL CITRATE (PF) 100 MCG/2ML IJ SOLN
100.0000 ug | INTRAMUSCULAR | Status: DC | PRN
  Administered 2015-07-04 – 2015-07-05 (×2): 100 ug via INTRAVENOUS
  Filled 2015-07-04 (×3): qty 2

## 2015-07-04 MED ORDER — ONDANSETRON HCL 4 MG/2ML IJ SOLN
4.0000 mg | Freq: Four times a day (QID) | INTRAMUSCULAR | Status: DC | PRN
  Administered 2015-07-04 – 2015-07-06 (×2): 4 mg via INTRAVENOUS
  Filled 2015-07-04 (×2): qty 2

## 2015-07-04 MED ORDER — METOCLOPRAMIDE HCL 5 MG/ML IJ SOLN
5.0000 mg | Freq: Three times a day (TID) | INTRAMUSCULAR | Status: DC | PRN
  Filled 2015-07-04: qty 2

## 2015-07-04 MED ORDER — MIDAZOLAM HCL 2 MG/2ML IJ SOLN
INTRAMUSCULAR | Status: AC
  Administered 2015-07-04: 2 mg
  Filled 2015-07-04: qty 2

## 2015-07-04 MED ORDER — ENOXAPARIN SODIUM 40 MG/0.4ML ~~LOC~~ SOLN
40.0000 mg | SUBCUTANEOUS | Status: DC
Start: 2015-07-04 — End: 2015-07-04

## 2015-07-04 MED ORDER — HYDROMORPHONE HCL 1 MG/ML IJ SOLN: 0.2500 mg | INTRAMUSCULAR | Status: DC | PRN

## 2015-07-04 MED ORDER — DOCUSATE SODIUM 100 MG PO CAPS
100.0000 mg | ORAL_CAPSULE | Freq: Two times a day (BID) | ORAL | Status: DC
  Administered 2015-07-06 – 2015-07-08 (×4): 100 mg via ORAL
  Filled 2015-07-04 (×11): qty 1

## 2015-07-04 MED ORDER — CHLORHEXIDINE GLUCONATE 0.12% ORAL RINSE (MEDLINE KIT)
15.0000 mL | Freq: Two times a day (BID) | OROMUCOSAL | Status: DC
  Administered 2015-07-04 – 2015-07-05 (×3): 15 mL via OROMUCOSAL

## 2015-07-04 MED ORDER — ONDANSETRON HCL 4 MG PO TABS: 4.0000 mg | ORAL_TABLET | Freq: Four times a day (QID) | ORAL | Status: DC | PRN

## 2015-07-04 MED ORDER — PROMETHAZINE HCL 25 MG/ML IJ SOLN: 6.2500 mg | INTRAMUSCULAR | Status: DC | PRN

## 2015-07-04 MED ORDER — ROCURONIUM BROMIDE 50 MG/5ML IV SOLN
INTRAVENOUS | Status: AC
  Filled 2015-07-04: qty 1

## 2015-07-04 MED ORDER — ACETAMINOPHEN 650 MG RE SUPP: 650.0000 mg | Freq: Four times a day (QID) | RECTAL | Status: DC | PRN

## 2015-07-04 MED ORDER — SODIUM CHLORIDE 0.9 % IV SOLN
INTRAVENOUS | Status: DC
  Administered 2015-07-04: 100 mL/h via INTRAVENOUS

## 2015-07-04 MED ORDER — MIDAZOLAM HCL 2 MG/2ML IJ SOLN: 2.0000 mg | Freq: Once | INTRAMUSCULAR | Status: AC

## 2015-07-04 MED ORDER — METOCLOPRAMIDE HCL 10 MG PO TABS: 5.0000 mg | ORAL_TABLET | Freq: Three times a day (TID) | ORAL | Status: DC | PRN

## 2015-07-04 MED ORDER — VITAL HIGH PROTEIN PO LIQD: 1000.0000 mL | ORAL | Status: DC

## 2015-07-04 MED ORDER — ANTISEPTIC ORAL RINSE SOLUTION (CORINZ)
7.0000 mL | Freq: Four times a day (QID) | OROMUCOSAL | Status: DC
  Administered 2015-07-04 – 2015-07-05 (×6): 7 mL via OROMUCOSAL

## 2015-07-04 MED ORDER — OXYCODONE HCL 5 MG PO TABS: 5.0000 mg | ORAL_TABLET | ORAL | Status: DC | PRN
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
## 2015-07-04 NOTE — Progress Notes (Signed)
     Subjective:  POD#1 I/D L leg abscess. Patient remains intubated.   Family was at the bedside this afternoon and concerned about the events after surgery that resulted in reintubation.  Dr. [facility] had spoke with the family last night, but communication was limited by the predominant language being Swahili. Nursing has been using translators throughout today.  Anesthesia was called to see if they would be able to re-explain the events of intra-op.   We will plan to have a family meeting tomorrow afternoon to go over treatment plan.    Bronchoscopy done today.  No hemorrhage noted.  Critical care team continuing to monitor pulmonary edema.    Objective:   VITALS:   Filed Vitals:   07/04/15 1745 07/04/15 1800 07/04/15 1815 07/04/15 1830  BP: 87/50 92/54 92/55  91/54  Pulse: 68 67 73 70  Temp:      TempSrc:      Resp: 15 15 15 15   Height:      Weight:      SpO2: 100% 100% 100% 100%   Intubated ABD soft Neurovascular intact Sensation intact distally Intact pulses distally Incision: dressing C/D/I Changed all dressings at the bedside tonight.  Scant drainage noted.  Only mild swelling.  No purulent fluid could be expressed.   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  Once extubated, will progress WB and activity.  Continuing Vanc and Zosyn until culture results.  Will consult ID for abx management.   Glen Kesinger Hilda LiasMarie 07/04/2015, 6:52 PM Cell [phone]

## 2015-07-04 NOTE — Procedures (Signed)
Bronchoscopy Procedure Note Jeremiah [name] [id number] 06/06/1996  Procedure: Bronchoscopy Indications: Obtain specimens for culture and/or other diagnostic studies and Remove secretions  Procedure Details Consent: Risks of procedure as well as the alternatives and risks of each were explained to the (patient/caregiver).  Consent for procedure obtained. Time Out: Verified patient identification, verified procedure, site/side was marked, verified correct patient position, special equipment/implants available, medications/allergies/relevent history reviewed, required imaging and test results available.  Performed  In preparation for procedure, patient was given 100% FiO2 and bronchoscope lubricated. Sedation: Benzodiazepines and fentanyl  Airway entered and the following bronchi were examined: RUL, RML, RLL, LUL, LLL and Bronchi.   Procedures performed: Brushings performed Bronchoscope removed.    Evaluation Hemodynamic Status: BP stable throughout; O2 sats: stable throughout Patient's Current Condition: stable Specimens:  Sent serosanguinous fluid Complications: No apparent complications Patient did tolerate procedure well.   Koren BoundYACOUB,Mansi Tokar 07/04/2015

## 2015-07-04 NOTE — Progress Notes (Signed)
Contacted the PA on call for ortho and explained that family was upset about not being spoken to and being updated about the events during/after surgery. The ortho PA suggested calling anesthesia and having them update family about events after the surgery. The ortho PA also stated they would be around later this afternoon to see the patient and could talk to family then.  Called and spoke anesthesia and they stated that both the MD and CRNA who were on the case last night are not currently working and the MD was not working [facility] manag[facility]ight. They stated that if the CRNA was working tonight they would call back to the unit.  Will continue to monitor and offer support to the family.
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
## 2015-07-04 NOTE — Anesthesia Postprocedure Evaluation (Signed)
Anesthesia Post Note  Patient: Jeremiah [name]  Procedure(s) Performed: Procedure(s) (LRB): IRRIGATION AND DEBRIDEMENT EXTREMITY (Left)  Patient location during evaluation: SICU Anesthesia Type: General Level of consciousness: sedated (intubated to ICU) Pain management: pain level controlled Respiratory status: patient re-intubated and patient on ventilator - see flowsheet for VS (postop pulm edma) Cardiovascular status: stable Anesthetic complications: yes Anesthetic complication details: required intubation and respiratory eventComments: Will need followup    Last Vitals:  Filed Vitals:   07/04/15 0000 07/04/15 0010  BP: 142/83   Pulse: 119 126  Temp:    Resp: 19 27    Last Pain:  Filed Vitals:   07/04/15 0011  PainSc: 3                  Aldena Worm,JAMES TERRILL

## 2015-07-04 NOTE — Progress Notes (Signed)
abg collected  
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
## 2015-07-04 NOTE — Progress Notes (Signed)
CRITICAL VALUE ALERT  Critical value received:  Lactic Acid 3.0  Date of notification:  07/04/2015  Time of notification:  02:00  Critical value read back:Yes.    Nurse who received alert:  Annabell Sabalourtney Wofford RN  MD notified (1st page):  MD Detterding  Time of first page:  02:03

## 2015-07-04 NOTE — Progress Notes (Signed)
Utilization review completed. Shelsey Rieth, RN, BSN. 

## 2015-07-04 NOTE — Progress Notes (Signed)
RN spoke with Dr. Darrick Pennaeterding about pink frothy secretions and coarse crackles, RN will continue to monitor.

## 2015-07-04 NOTE — Procedures (Signed)
Bedside Bronchoscopy Procedure Note Jeremiah RiggRashidi [name] [id number] 10/27/1995  Procedure: Bronchoscopy Indications: Obtain specimens for culture and/or other diagnostic studies  Procedure Details: ET Tube Size: ET Tube secured at lip (cm): Bite block in place: Yes In preparation for procedure, Patient hyper-oxygenated with 100 % FiO2 Airway entered and the following bronchi were examined: RUL.   Bronchoscope removed.    Evaluation BP 86/51 mmHg  Pulse 70  Temp(Src) 97.6 F (36.4 C) (Oral)  Resp 15  Ht 5\' 9"  (1.753 m)  Wt 128 lb 12 oz (58.4 kg)  BMI 19.00 kg/m2  SpO2 100% Breath Sounds:Diminished O2 sats: stable throughout Patient's Current Condition: stable Specimens:  Sent serosanguinous fluid Complications: No apparent complications Patient did tolerate procedure well.   Jeremiah [name], [name] 07/04/2015, 2:35 PM

## 2015-07-04 NOTE — Progress Notes (Signed)
Family arrived, they evidently had no idea that there were issues with extubating the patient.  Patient has bleeding in his lungs.  Through the interpreter to explain that patient had vocal cord spasm and was reintubated and likely developed negative pressure pulmonary edema and some bleeding that I can not explain.  They relayed that nobody has spoken to them or let them know anything about what happened and they were quite upset.  I appeased the situation bedside as much as I was able to.  They demanded to speak with the surgeon, PA from ortho called back and informed RN that their best bet is to call anesthesia and RN is contacting anesthesia.  In the meantime, I bronched the patient and serial eloquots were done, no diffuse alveolar hemorrhage, likely pulmonary edema or a traumatic intubation.  No need for steroids at this time.  I will avoid diuretics at this time given low BP.  The patient is critically ill with multiple organ systems failure and requires high complexity decision making for assessment and support, frequent evaluation and titration of therapies, application of advanced monitoring technologies and extensive interpretation of multiple databases.   Critical Care Time devoted to patient care services described in this note is  60  Minutes. This time reflects time of care of this signee Dr Koren BoundWesam Ralph Brouwer. This critical care time does not reflect procedure time, or teaching time or supervisory time of PA/NP/Med student/Med Resident etc but could involve care discussion time.  Alyson ReedyWesam G. Masiah Lewing, M.D. [facility] Pulmonary/[facility] Care Medicine. Pager: [phone]. After hours pager: [phone].

## 2015-07-05 ENCOUNTER — Encounter (HOSPITAL_COMMUNITY): Payer: Self-pay | Admitting: Orthopedic Surgery

## 2015-07-05 ENCOUNTER — Inpatient Hospital Stay (HOSPITAL_COMMUNITY): Payer: Medicaid Other

## 2015-07-05 LAB — BASIC METABOLIC PANEL
ANION GAP: 8 (ref 5–15)
BUN: 15 mg/dL (ref 6–20)
CALCIUM: 8.6 mg/dL — AB (ref 8.9–10.3)
CO2: 29 mmol/L (ref 22–32)
Chloride: 99 mmol/L — ABNORMAL LOW (ref 101–111)
Creatinine, Ser: 0.76 mg/dL (ref 0.61–1.24)
GLUCOSE: 124 mg/dL — AB (ref 65–99)
Potassium: 3.8 mmol/L (ref 3.5–5.1)
SODIUM: 136 mmol/L (ref 135–145)

## 2015-07-05 LAB — CBC
HCT: 25.4 % — ABNORMAL LOW (ref 39.0–52.0)
Hemoglobin: 7 g/dL — ABNORMAL LOW (ref 13.0–17.0)
MCH: 18.4 pg — ABNORMAL LOW (ref 26.0–34.0)
MCHC: 27.6 g/dL — ABNORMAL LOW (ref 30.0–36.0)
MCV: 66.8 fL — ABNORMAL LOW (ref 78.0–100.0)
PLATELETS: 331 10*3/uL (ref 150–400)
RBC: 3.8 MIL/uL — ABNORMAL LOW (ref 4.22–5.81)
RDW: 16.1 % — AB (ref 11.5–15.5)
WBC: 10 10*3/uL (ref 4.0–10.5)

## 2015-07-05 LAB — BLOOD GAS, ARTERIAL
Acid-Base Excess: 4.4 mmol/L — ABNORMAL HIGH (ref 0.0–2.0)
Bicarbonate: 28.8 mEq/L — ABNORMAL HIGH (ref 20.0–24.0)
DRAWN BY: 418751
FIO2: 0.4
LHR: 15 {breaths}/min
MECHVT: 440 mL
O2 SAT: 99.6 %
PEEP/CPAP: 5 cmH2O
PH ART: 7.411 (ref 7.350–7.450)
Patient temperature: 98.6
TCO2: 30.2 mmol/L (ref 0–100)
pCO2 arterial: 46.3 mmHg — ABNORMAL HIGH (ref 35.0–45.0)
pO2, Arterial: 169 mmHg — ABNORMAL HIGH (ref 80.0–100.0)

## 2015-07-05 LAB — URINE CULTURE: Culture: NO GROWTH

## 2015-07-05 LAB — GLUCOSE, CAPILLARY
GLUCOSE-CAPILLARY: 128 mg/dL — AB (ref 65–99)
GLUCOSE-CAPILLARY: 117 mg/dL — AB (ref 65–99)

## 2015-07-05 LAB — MAGNESIUM: MAGNESIUM: 2 mg/dL (ref 1.7–2.4)

## 2015-07-05 LAB — PHOSPHORUS: PHOSPHORUS: 3.6 mg/dL (ref 2.5–4.6)

## 2015-07-05 MED ORDER — SODIUM CHLORIDE 0.9 % IV SOLN
25.0000 ug/h | INTRAVENOUS | Status: DC
  Administered 2015-07-05: 25 ug/h via INTRAVENOUS
  Filled 2015-07-05: qty 50

## 2015-07-05 MED ORDER — ENOXAPARIN SODIUM 40 MG/0.4ML ~~LOC~~ SOLN
40.0000 mg | SUBCUTANEOUS | Status: DC
Start: 2015-07-05 — End: 2015-07-09
  Administered 2015-07-06 – 2015-07-08 (×3): 40 mg via SUBCUTANEOUS
  Filled 2015-07-05 (×6): qty 0.4

## 2015-07-05 MED ORDER — FENTANYL BOLUS VIA INFUSION
50.0000 ug | INTRAVENOUS | Status: DC | PRN
  Filled 2015-07-05: qty 50

## 2015-07-05 MED ORDER — FENTANYL CITRATE (PF) 100 MCG/2ML IJ SOLN: 50.0000 ug | Freq: Once | INTRAMUSCULAR | Status: DC

## 2015-07-05 MED ORDER — FENTANYL CITRATE (PF) 100 MCG/2ML IJ SOLN
25.0000 ug | INTRAMUSCULAR | Status: DC | PRN
  Administered 2015-07-05: 50 ug via INTRAVENOUS
  Administered 2015-07-06: 25 ug via INTRAVENOUS
  Administered 2015-07-06 (×2): 50 ug via INTRAVENOUS
  Filled 2015-07-05 (×4): qty 2

## 2015-07-05 NOTE — Procedures (Signed)
Extubation Procedure Note  Patient Details:   Name: Jeremiah [name] DOB: 06/07/1996 MRN: [id number]   Airway Documentation:     Evaluation  O2 sats: stable throughout Complications: No apparent complications Patient did tolerate procedure well. Bilateral Breath Sounds: Clear Suctioning: Airway Yes  Patient tolerated wean. MD ordered to extubate. Positive for cuff leak. Patient extubated to a 2 Lpm nasal cannula. No signs of dyspnea or stridor. Patient resting comfortably.   Ancil BoozerSmallwood, Felicity Penix 07/05/2015, 12:09 PM

## 2015-07-05 NOTE — Progress Notes (Signed)
FPTS Social Note  Continuing to follow chart. Patient seen this AM and he appears alert and making motions towards ETT (after propofol stopped). Still on vanc and zosyn. Fentanyl at 16000mcg/hr, propofol now back to 195ml/hr. Will continue to follow socially pending clinical course.  Tawni CarnesAndrew Layia Walla, MD 07/05/2015, 10:46 AM PGY-3, [facility]
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
## 2015-07-05 NOTE — Progress Notes (Signed)
eLink Physician-Brief Progress Note Patient Name: Gates RiggRashidi Monjaras DOB: 02/17/1996 MRN: [id number]   Date of Service  07/05/2015  HPI/Events of Note  Patient intubated with ongoing agitation despite max propofol and regular dosing of PRN fentanyl.  Camera check shows patient alert, banging mittened hands on railing.  eICU Interventions  Plan: Start cont fentanyl in addition to propofol with PRN fentanyl for breakthrough Continue to monitor patient.     Intervention Category Major Interventions: Delirium, psychosis, severe agitation - evaluation and management  Gabe Glace 07/05/2015, 1:28 AM

## 2015-07-06 DIAGNOSIS — M86162 Other acute osteomyelitis, left tibia and fibula: Secondary | ICD-10-CM

## 2015-07-06 DIAGNOSIS — Y838 Other surgical procedures as the cause of abnormal reaction of the patient, or of later complication, without mention of misadventure at the time of the procedure: Secondary | ICD-10-CM

## 2015-07-06 LAB — BASIC METABOLIC PANEL
ANION GAP: 10 (ref 5–15)
BUN: 8 mg/dL (ref 6–20)
CO2: 31 mmol/L (ref 22–32)
Calcium: 9 mg/dL (ref 8.9–10.3)
Chloride: 102 mmol/L (ref 101–111)
Creatinine, Ser: 0.79 mg/dL (ref 0.61–1.24)
Glucose, Bld: 105 mg/dL — ABNORMAL HIGH (ref 65–99)
POTASSIUM: 3.5 mmol/L (ref 3.5–5.1)
SODIUM: 143 mmol/L (ref 135–145)

## 2015-07-06 LAB — VANCOMYCIN, TROUGH: Vancomycin Tr: 13 ug/mL (ref 10.0–20.0)

## 2015-07-06 LAB — CBC
HCT: 28.3 % — ABNORMAL LOW (ref 39.0–52.0)
Hemoglobin: 7.8 g/dL — ABNORMAL LOW (ref 13.0–17.0)
MCH: 18.7 pg — ABNORMAL LOW (ref 26.0–34.0)
MCHC: 27.6 g/dL — ABNORMAL LOW (ref 30.0–36.0)
MCV: 67.7 fL — ABNORMAL LOW (ref 78.0–100.0)
PLATELETS: 402 10*3/uL — AB (ref 150–400)
RBC: 4.18 MIL/uL — AB (ref 4.22–5.81)
RDW: 16.5 % — ABNORMAL HIGH (ref 11.5–15.5)
WBC: 6.6 10*3/uL (ref 4.0–10.5)

## 2015-07-06 LAB — MAGNESIUM: MAGNESIUM: 2.1 mg/dL (ref 1.7–2.4)

## 2015-07-06 LAB — PHOSPHORUS: Phosphorus: 3.4 mg/dL (ref 2.5–4.6)

## 2015-07-06 MED ORDER — INFLUENZA VAC SPLIT QUAD 0.5 ML IM SUSY
0.5000 mL | PREFILLED_SYRINGE | INTRAMUSCULAR | Status: AC
  Administered 2015-07-07: 0.5 mL via INTRAMUSCULAR
  Filled 2015-07-06: qty 0.5

## 2015-07-06 MED ORDER — VANCOMYCIN HCL IN DEXTROSE 1-5 GM/200ML-% IV SOLN
1000.0000 mg | Freq: Three times a day (TID) | INTRAVENOUS | Status: DC
  Filled 2015-07-06: qty 200

## 2015-07-06 MED ORDER — POTASSIUM CHLORIDE 10 MEQ/100ML IV SOLN
10.0000 meq | INTRAVENOUS | Status: DC
  Administered 2015-07-06: 10 meq via INTRAVENOUS
  Filled 2015-07-06: qty 100

## 2015-07-06 MED ORDER — OXYCODONE HCL 5 MG PO TABS
5.0000 mg | ORAL_TABLET | ORAL | Status: DC | PRN
  Administered 2015-07-07 – 2015-07-09 (×9): 5 mg via ORAL
  Filled 2015-07-06 (×10): qty 1

## 2015-07-06 MED ORDER — SODIUM CHLORIDE 0.9 % IV SOLN: INTRAVENOUS | Status: DC | PRN

## 2015-07-06 MED ORDER — NORTRIPTYLINE HCL 25 MG PO CAPS
25.0000 mg | ORAL_CAPSULE | Freq: Every day | ORAL | Status: DC
  Administered 2015-07-06 – 2015-07-08 (×3): 25 mg via ORAL
  Filled 2015-07-06 (×6): qty 1

## 2015-07-06 MED ORDER — VANCOMYCIN HCL IN DEXTROSE 750-5 MG/150ML-% IV SOLN
750.0000 mg | Freq: Three times a day (TID) | INTRAVENOUS | Status: DC
  Filled 2015-07-06 (×2): qty 150
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
## 2015-07-06 NOTE — Progress Notes (Addendum)
Pharmacy Antibiotic Follow-up Note  Gates RiggRashidi Spoon is a 19 y.o. year-old male admitted on 07/03/2015.  The patient is currently on day 4 of Vanc/Zosyn for wound infection/sepsis.  Assessment/Plan: Vanc trough subtherapeutic at 13 (goal 15-20) but trough drawn 1.5 hours late.  Would expect pt to be close to goal of 15 if trough was drawn on time. Will not change dose at this time   Continue Vancomycin 750 mg IV q8h VT once at steady state if to continue therapy Zosyn 3.375g IV q8 (4 hour infusion) Follow renal function, cultures, LOT, level if needed  Temp (24hrs), Avg:98.2 F (36.8 C), Min:97.8 F (36.6 C), Max:98.5 F (36.9 C)   Recent Labs Lab 07/03/15 1823 07/05/15 0219 07/06/15 0259  WBC 10.0 10.0 6.6    Recent Labs Lab 07/03/15 1823 07/04/15 1300 07/05/15 0219 07/06/15 0259  CREATININE 0.91 0.85 0.76 0.79   Estimated Creatinine Clearance: 121.4 mL/min (by C-G formula based on Cr of 0.79).    No Known Allergies  Antimicrobials this admission: 12/28 Vanc>> (unsure if 6AM dose given 12/29 & 12/30, not charted x2 days, dayshift RN unsure-spoke with her both days once aware of missing dose; safety zone completed & abx retimed) 12/28 Zosyn>>  Levels/dose changes this admission: 12/31 Vanc Tr 13 (Drawn 1.5 hrs late)   Microbiology results: 12/28 MRSA PCR negative; Staph aureus postitive (surgical screen) 12/28 LLE wound >> mod GNR 12/28 Blood >> ngtd 12/28 Urine >>NEG  Thank you for allowing pharmacy to be a part of this patient's care.  Hazle NordmannKelsy Harshan Kearley, PharmD Pharmacy Resident [phone] 07/06/2015 12:51 PM

## 2015-07-06 NOTE — Progress Notes (Signed)
[facility] FOR AM LAB REPLACEMENT ONLY  The [facility] does [facility] based on the criteria listed below:   1. Is GFR >/= 40 ml/min? Yes.    Patient's GFR today is >60 2. Is urine output >/= 0.5 ml/kg/hr for the last 6 hours? Yes.   Patient's UOP is 1.05 ml/kg/hr 3. Is BUN < 60 mg/dL? No.  Patient's BUN today is 8 4. Abnormal electrolyte(s): K3.5 5. Ordered repletion with: per protocol 6. If a panic level lab has been reported, has the CCM MD in charge been notified? Yes.  .   Physician:  E Deterding,MD  Melrose NakayamaChisholm, Zanae Kuehnle William 07/06/2015 6:07 AM

## 2015-07-06 NOTE — Progress Notes (Signed)
     Subjective:  POD#2 I/D of L tibial wound infection. Patient reports pain as mild to moderate.  Resting comfortably in the bed this morning.  Plan is to transfer to medical floor today.  Will see how the patient does with mobilization with PT.   Objective:   VITALS:   Filed Vitals:   07/06/15 0600 07/06/15 0700 07/06/15 0754 07/06/15 0800  BP: 91/50 100/66  103/61  Pulse: 74 79  68  Temp:   98.4 F (36.9 C)   TempSrc:   Oral   Resp: 20 31  17   Height:      Weight:      SpO2: 94% 90%  100%    Neurologically intact ABD soft Neurovascular intact Sensation intact distally Intact pulses distally Dorsiflexion/Plantar flexion intact Incision: dressing C/D/I   Lab Results  Component Value Date   WBC 6.6 07/06/2015   HGB 7.8* 07/06/2015   HCT 28.3* 07/06/2015   MCV 67.7* 07/06/2015   PLT 402* 07/06/2015   BMET    Component Value Date/Time   NA 143 07/06/2015 0259   K 3.5 07/06/2015 0259   CL 102 07/06/2015 0259   CO2 31 07/06/2015 0259   GLUCOSE 105* 07/06/2015 0259   BUN 8 07/06/2015 0259   CREATININE 0.79 07/06/2015 0259   CALCIUM 9.0 07/06/2015 0259   GFRNONAA >60 07/06/2015 0259   GFRAA >60 07/06/2015 0259     Assessment/Plan: 3 Days Post-Op   Principal Problem:   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   Jeremiah [name] Jeremiah [name] 07/06/2015, 9:45 AM Cell [phone]
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
## 2015-07-07 LAB — BASIC METABOLIC PANEL
Anion gap: 9 (ref 5–15)
BUN: 7 mg/dL (ref 6–20)
CALCIUM: 9.4 mg/dL (ref 8.9–10.3)
CO2: 30 mmol/L (ref 22–32)
Chloride: 101 mmol/L (ref 101–111)
Creatinine, Ser: 0.85 mg/dL (ref 0.61–1.24)
GFR calc Af Amer: >60 mL/min (ref >60)
GLUCOSE: 90 mg/dL (ref 65–99)
Potassium: 3.5 mmol/L (ref 3.5–5.1)
SODIUM: 140 mmol/L (ref 135–145)

## 2015-07-07 LAB — CBC
HCT: 26.6 % — ABNORMAL LOW (ref 39.0–52.0)
Hemoglobin: 7.5 g/dL — ABNORMAL LOW (ref 13.0–17.0)
MCH: 19.2 pg — ABNORMAL LOW (ref 26.0–34.0)
MCHC: 28.2 g/dL — AB (ref 30.0–36.0)
MCV: 68.2 fL — ABNORMAL LOW (ref 78.0–100.0)
PLATELETS: 365 10*3/uL (ref 150–400)
RBC: 3.9 MIL/uL — ABNORMAL LOW (ref 4.22–5.81)
RDW: 16.7 % — AB (ref 11.5–15.5)
WBC: 6.1 10*3/uL (ref 4.0–10.5)

## 2015-07-07 LAB — CULTURE, BAL-QUANTITATIVE W GRAM STAIN: Colony Count: NO GROWTH

## 2015-07-07 LAB — CULTURE, BAL-QUANTITATIVE: CULTURE: NO GROWTH

## 2015-07-07 MED ORDER — HYDROMORPHONE HCL 1 MG/ML IJ SOLN
1.0000 mg | INTRAMUSCULAR | Status: DC | PRN
  Administered 2015-07-08: 1 mg via INTRAVENOUS
  Filled 2015-07-07: qty 1
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
## 2015-07-07 NOTE — Progress Notes (Signed)
     Subjective:  POD# 3 I/D of L tibia wound infection. Patient reports pain as mild to moderate.  Resting comfortably in bed this morning.  No issues overnight.  Will plan to mobilize with PT today.   Objective:   VITALS:   Filed Vitals:   07/06/15 0800 07/06/15 1400 07/06/15 2001 07/07/15 0651  BP: 103/61 108/64 105/50 103/57  Pulse: 68 50 76 62  Temp:  98 F (36.7 C) 98.7 F (37.1 C) 97.7 F (36.5 C)  TempSrc:   Oral Oral  Resp: 17 16 16 16   Height:      Weight:      SpO2: 100% 100% 100% 99%    Neurologically intact ABD soft Neurovascular intact Sensation intact distally Intact pulses distally Dorsiflexion/Plantar flexion intact Incision: dressing C/D/I   Lab Results  Component Value Date   WBC 6.1 07/07/2015   HGB 7.5* 07/07/2015   HCT 26.6* 07/07/2015   MCV 68.2* 07/07/2015   PLT 365 07/07/2015   BMET    Component Value Date/Time   NA 140 07/07/2015 0508   K 3.5 07/07/2015 0508   CL 101 07/07/2015 0508   CO2 30 07/07/2015 0508   GLUCOSE 90 07/07/2015 0508   BUN 7 07/07/2015 0508   CREATININE 0.85 07/07/2015 0508   CALCIUM 9.4 07/07/2015 0508   GFRNONAA >60 07/07/2015 0508   GFRAA >60 07/07/2015 0508     Assessment/Plan: 4 Days Post-Op   Principal Problem:   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

## 2015-07-07 NOTE — Progress Notes (Signed)
Interim Progress Note  Discussed with surgery and agreed that since infectious disease now taking over antibiotic therapy, Family Medicine consult no longer needed. Please reconsult anytime and we would be happy to see this patient again.  Jacquelin Hawkingalph Trinka Keshishyan, MD PGY-3, [facility] 07/07/2015, 6:54 AM

## 2015-07-08 DIAGNOSIS — B9689 Other specified bacterial agents as the cause of diseases classified elsewhere: Secondary | ICD-10-CM

## 2015-07-08 LAB — CULTURE, BLOOD (ROUTINE X 2)
CULTURE: NO GROWTH
Culture: NO GROWTH

## 2015-07-08 LAB — HIV ANTIBODY (ROUTINE TESTING W REFLEX): HIV Screen 4th Generation wRfx: NONREACTIVE

## 2015-07-08 MED ORDER — LEVOFLOXACIN 500 MG PO TABS
750.0000 mg | ORAL_TABLET | Freq: Every day | ORAL | Status: DC
  Administered 2015-07-08: 750 mg via ORAL
  Filled 2015-07-08: qty 2

## 2015-07-08 NOTE — Progress Notes (Signed)
     Subjective:  POD#5 I/D of L tibial wound abscess.  Patient reports pain as mild to moderate.  Mobilized well with PT yesterday.  They have signed off at this time, just recommending ambulation with nursing. ID saw today and changed to oral levofloxacin.  No insurance, so case management trying to coordinate getting the medication.  Patient's case is being discussed with [facility].  Tentative plan is for discharge to home on oral abx and follow up with [facility] as outpatient to further plan treatment course.  Possible discharge tomorrow if medications can be arranged.   Objective:   VITALS:   Filed Vitals:   07/07/15 1356 07/07/15 2045 07/08/15 0543 07/08/15 1412  BP: 100/50 107/61 112/70 102/50  Pulse: 59 73 83 70  Temp: 98.1 F (36.7 C) 98.6 F (37 C) 98.4 F (36.9 C) 98.4 F (36.9 C)  TempSrc: Oral Oral Oral Oral  Resp: 18 18 16 18   Height:      Weight:      SpO2: 100% 100% 99% 100%    Neurologically intact ABD soft Neurovascular intact Sensation intact distally Intact pulses distally Dorsiflexion/Plantar flexion intact Incision: dressing C/D/I   Lab Results  Component Value Date   WBC 6.1 07/07/2015   HGB 7.5* 07/07/2015   HCT 26.6* 07/07/2015   MCV 68.2* 07/07/2015   PLT 365 07/07/2015   BMET    Component Value Date/Time   NA 140 07/07/2015 0508   K 3.5 07/07/2015 0508   CL 101 07/07/2015 0508   CO2 30 07/07/2015 0508   GLUCOSE 90 07/07/2015 0508   BUN 7 07/07/2015 0508   CREATININE 0.85 07/07/2015 0508   CALCIUM 9.4 07/07/2015 0508   GFRNONAA >60 07/07/2015 0508   GFRAA >60 07/07/2015 0508     Assessment/Plan: 5 Days Post-Op   Principal Problem:   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    Romond Pipkins Hilda LiasMarie 07/08/2015, 2:53 PM Cell [phone]
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
## 2015-07-09 ENCOUNTER — Ambulatory Visit: Payer: Medicaid Other | Attending: Orthopedic Surgery | Admitting: Physical Therapy

## 2015-07-09 ENCOUNTER — Encounter (HOSPITAL_COMMUNITY): Payer: Self-pay | Admitting: General Practice

## 2015-07-09 LAB — WOUND CULTURE

## 2015-07-09 LAB — SEDIMENTATION RATE: Sed Rate: 50 mm/hr — ABNORMAL HIGH (ref 0–16)

## 2015-07-09 LAB — ANAEROBIC CULTURE

## 2015-07-09 LAB — C-REACTIVE PROTEIN: CRP: 1.3 mg/dL — AB (ref <1.0)

## 2015-07-09 MED ORDER — LEVOFLOXACIN 750 MG PO TABS: 750.0000 mg | ORAL_TABLET | Freq: Every day | ORAL | Status: DC

## 2015-07-09 MED ORDER — OXYCODONE HCL 5 MG PO TABS: 5.0000 mg | ORAL_TABLET | ORAL | Status: DC | PRN

## 2015-07-09 MED ORDER — DOCUSATE SODIUM 100 MG PO CAPS: 100.0000 mg | ORAL_CAPSULE | Freq: Two times a day (BID) | ORAL | Status: DC

## 2015-07-09 MED ORDER — ENOXAPARIN SODIUM 40 MG/0.4ML ~~LOC~~ SOLN
40.0000 mg | SUBCUTANEOUS | Status: DC
Start: 2015-07-09 — End: 2015-07-29

## 2015-07-09 NOTE — Progress Notes (Signed)
Patient ID: Jeremiah [name], male   DOB: 02/02/1996, 20 y.o.   MRN: [id number]         [facility]    Date of Admission:  07/03/2015   Total days of antibiotics 6        Day 2 levofloxacin         He is tolerating his levofloxacin for Serratia infection at the site of his previous open left tib-fib fractures. I spoke with his case manager, Darl PikesSusan bradycardia, today and she has been able to obtain a 6 week supply of levofloxacin for him. I also spoke with Lindwood QuaBrittany Kelly, Dr. Greig RightMurphy's PA. [facility]. I asked GrenadaBrittany to call me if she would like for us to arrange infectious disease followup in our clinic.  Cliffton AstersJohn Johnie Stadel, MD [facility] [phone] pager   [phone] cell 07/09/2015, 1:32 PM

## 2015-07-09 NOTE — Discharge Instructions (Signed)
Keep dressings clean and dry.  Daily dry dressing change.    Weight bearing as tolerated in the L leg.  CAM boot for comfort only  Continue daily antibiotics [facility]
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
## 2015-07-09 NOTE — Care Management Note (Signed)
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 Patient underwent I & D with placement of antibiotic beads.      Action/Plan: Patient will discharge home on oral antibiotics and lovenox. Case manager has provided information concerning MATCH program. [place] Dr. Greig RightMurphy's office is coordinating [facility].    Expected Discharge Date:    07/09/15              Expected Discharge Plan:   home/self care  In-House Referral:     Discharge planning Services  CM Consult, Medication Assistance, MATCH Program  Post Acute Care Choice:    Choice offered to:  NA  DME Arranged:  N/A patient has all necessary DME DME Agency:     HH Arranged:  NA HH Agency:     Status of Service:  Completed, signed off  Medicare Important Message Given:    Date Medicare IM Given:    Medicare IM give by:    Date Additional Medicare IM Given:    Additional Medicare Important Message give by:     If discussed at Long Length of Stay Meetings, dates discussed:    Additional Comments:  Durenda GuthrieBrady, Ronald Londo Naomi, RN 07/09/2015, 10:59 AM

## 2015-07-09 NOTE — Progress Notes (Signed)
Pt and family given all discharge instructions  and prescriptions with the help of a translator that spoke swahili. Pt and family asked questions and voiced understanding of instructions. Pt and family taught how to change leg dressings and understands how to self administer lovenox. Leg drsg cdi with ace.  Pt and family made aware that Dr. Greig RightMurphy's office will call them with an app[facility]. Prescriptions given and family has all belongings. Pt discharged via wheelchair home with family.

## 2015-07-09 NOTE — Progress Notes (Signed)
Patient doing well.  Dr. Eulah PontMurphy has discussed the case with Devonne DoughtyGreg Della Rocca of [facility].  Plan is for outpatient follow up this week for further surgical intervention.  Will discharge to home today on Levofloxacin and Lovenox.  Case management working to get help with cost of prescriptions.  Will continue dry dressings over incision area.  Weight bearing as tolerated in the L leg and ok to ambulate without the CAM boot.

## 2015-07-11 ENCOUNTER — Telehealth: Payer: Self-pay | Admitting: Physical Therapy

## 2015-07-11 ENCOUNTER — Ambulatory Visit: Payer: Medicaid Other | Admitting: Physical Therapy

## 2015-07-11 NOTE — Telephone Encounter (Signed)
Christia ReadingBrittney M Kelly PA from [facility] called Garen LahLawrie Beardsley PT to return telephone call concerning precautians/new orders since patient had been admitted last week for I&D and sepsis.   Ms Tresa EndoKelly requested that Mr. [name] needs to be discharged from PT and he has an [facility] for hard ware [facility] and does not need further [facility] at this time at [facility].   Garen LahLawrie Beardsley, PT 07/11/2015 2:38 PM Phone: [phone] Fax: [phone]

## 2015-07-11 NOTE — Telephone Encounter (Signed)
Pt came to clinic without updated prescription which was needed due to pt recent hospitalization and subsequent surgery, sepsis, respiratory distress.  Pt /interpreter explained need for new RX.  Dr. Eulah PontMurphy office called to have new updated orders and precautians sent to office to continue PT with updated precautians.   Jeremiah [name], PT 07/11/2015 9:53 AM Phone: [phone] Fax: [phone]

## 2015-07-16 ENCOUNTER — Encounter (HOSPITAL_COMMUNITY): Payer: Self-pay

## 2015-07-16 ENCOUNTER — Emergency Department (HOSPITAL_COMMUNITY): Payer: Medicaid Other

## 2015-07-16 ENCOUNTER — Encounter: Payer: Self-pay | Admitting: Physical Therapy

## 2015-07-16 DIAGNOSIS — Y9289 Other specified places as the place of occurrence of the external cause: Secondary | ICD-10-CM | POA: Diagnosis not present

## 2015-07-16 DIAGNOSIS — Z7901 Long term (current) use of anticoagulants: Secondary | ICD-10-CM | POA: Insufficient documentation

## 2015-07-16 DIAGNOSIS — Z79899 Other long term (current) drug therapy: Secondary | ICD-10-CM | POA: Diagnosis not present

## 2015-07-16 DIAGNOSIS — Z8673 Personal history of transient ischemic attack (TIA), and cerebral infarction without residual deficits: Secondary | ICD-10-CM | POA: Insufficient documentation

## 2015-07-16 DIAGNOSIS — R42 Dizziness and giddiness: Secondary | ICD-10-CM | POA: Diagnosis present

## 2015-07-16 DIAGNOSIS — Z043 Encounter for examination and observation following other accident: Secondary | ICD-10-CM | POA: Insufficient documentation

## 2015-07-16 DIAGNOSIS — Z792 Long term (current) use of antibiotics: Secondary | ICD-10-CM | POA: Diagnosis not present

## 2015-07-16 DIAGNOSIS — Z8619 Personal history of other infectious and parasitic diseases: Secondary | ICD-10-CM | POA: Diagnosis not present

## 2015-07-16 DIAGNOSIS — Z8781 Personal history of (healed) traumatic fracture: Secondary | ICD-10-CM | POA: Diagnosis not present

## 2015-07-16 DIAGNOSIS — Y998 Other external cause status: Secondary | ICD-10-CM | POA: Insufficient documentation

## 2015-07-16 DIAGNOSIS — Y9389 Activity, other specified: Secondary | ICD-10-CM | POA: Diagnosis not present

## 2015-07-16 DIAGNOSIS — L03116 Cellulitis of left lower limb: Secondary | ICD-10-CM | POA: Diagnosis not present

## 2015-07-16 LAB — COMPREHENSIVE METABOLIC PANEL
ALBUMIN: 3.9 g/dL (ref 3.5–5.0)
ALK PHOS: 85 U/L (ref 38–126)
ALT: 12 U/L — AB (ref 17–63)
AST: 28 U/L (ref 15–41)
Anion gap: 13 (ref 5–15)
BILIRUBIN TOTAL: 0.5 mg/dL (ref 0.3–1.2)
BUN: 13 mg/dL (ref 6–20)
CO2: 25 mmol/L (ref 22–32)
CREATININE: 0.85 mg/dL (ref 0.61–1.24)
Calcium: 9.5 mg/dL (ref 8.9–10.3)
Chloride: 102 mmol/L (ref 101–111)
GFR calc Af Amer: >60 mL/min (ref >60)
GFR calc non Af Amer: >60 mL/min (ref >60)
GLUCOSE: 80 mg/dL (ref 65–99)
POTASSIUM: 3.5 mmol/L (ref 3.5–5.1)
Sodium: 140 mmol/L (ref 135–145)
TOTAL PROTEIN: 7.7 g/dL (ref 6.5–8.1)

## 2015-07-16 LAB — CBC WITH DIFFERENTIAL/PLATELET
BASOS ABS: 0.1 10*3/uL (ref 0.0–0.1)
BASOS PCT: 1 %
EOS ABS: 0.2 10*3/uL (ref 0.0–0.7)
Eosinophils Relative: 3 %
HCT: 29.7 % — ABNORMAL LOW (ref 39.0–52.0)
HEMOGLOBIN: 8.3 g/dL — AB (ref 13.0–17.0)
LYMPHS ABS: 2 10*3/uL (ref 0.7–4.0)
LYMPHS PCT: 33 %
MCH: 18.7 pg — ABNORMAL LOW (ref 26.0–34.0)
MCHC: 27.9 g/dL — ABNORMAL LOW (ref 30.0–36.0)
MCV: 66.9 fL — ABNORMAL LOW (ref 78.0–100.0)
MONOS PCT: 8 %
Monocytes Absolute: 0.5 10*3/uL (ref 0.1–1.0)
NEUTROS ABS: 3.4 10*3/uL (ref 1.7–7.7)
Neutrophils Relative %: 55 %
Platelets: 432 10*3/uL — ABNORMAL HIGH (ref 150–400)
RBC: 4.44 MIL/uL (ref 4.22–5.81)
RDW: 17 % — ABNORMAL HIGH (ref 11.5–15.5)
WBC: 6.2 10*3/uL (ref 4.0–10.5)

## 2015-07-16 LAB — I-STAT CG4 LACTIC ACID, ED: Lactic Acid, Venous: 0.58 mmol/L (ref 0.5–2.0)

## 2015-07-16 NOTE — ED Notes (Signed)
Mothers phone number: [phone]. She speaks limited english. [place] family from [place] and speaks [place]
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
## 2015-07-17 ENCOUNTER — Encounter (HOSPITAL_COMMUNITY): Payer: Self-pay | Admitting: *Deleted

## 2015-07-17 ENCOUNTER — Encounter (HOSPITAL_COMMUNITY): Payer: Self-pay

## 2015-07-17 ENCOUNTER — Emergency Department (HOSPITAL_COMMUNITY)
Admission: EM | Admit: 2015-07-17 | Discharge: 2015-07-17 | Disposition: A | Discharge status: Home / Self Care | Payer: Medicaid Other | Attending: Emergency Medicine | Admitting: Emergency Medicine

## 2015-07-17 ENCOUNTER — Emergency Department (HOSPITAL_COMMUNITY): Payer: Medicaid Other

## 2015-07-17 ENCOUNTER — Emergency Department (HOSPITAL_COMMUNITY)
Admission: EM | Admit: 2015-07-17 | Discharge: 2015-07-17 | Disposition: A | Discharge status: Home / Self Care | Payer: Medicaid Other | Source: Home / Self Care | Attending: Emergency Medicine | Admitting: Emergency Medicine

## 2015-07-17 DIAGNOSIS — Z8673 Personal history of transient ischemic attack (TIA), and cerebral infarction without residual deficits: Secondary | ICD-10-CM

## 2015-07-17 DIAGNOSIS — W228XXA Striking against or struck by other objects, initial encounter: Secondary | ICD-10-CM

## 2015-07-17 DIAGNOSIS — Y9389 Activity, other specified: Secondary | ICD-10-CM | POA: Insufficient documentation

## 2015-07-17 DIAGNOSIS — S80811A Abrasion, right lower leg, initial encounter: Secondary | ICD-10-CM | POA: Insufficient documentation

## 2015-07-17 DIAGNOSIS — Y998 Other external cause status: Secondary | ICD-10-CM

## 2015-07-17 DIAGNOSIS — Z8619 Personal history of other infectious and parasitic diseases: Secondary | ICD-10-CM | POA: Insufficient documentation

## 2015-07-17 DIAGNOSIS — Y9289 Other specified places as the place of occurrence of the external cause: Secondary | ICD-10-CM | POA: Insufficient documentation

## 2015-07-17 DIAGNOSIS — Z79899 Other long term (current) drug therapy: Secondary | ICD-10-CM | POA: Insufficient documentation

## 2015-07-17 DIAGNOSIS — Z8781 Personal history of (healed) traumatic fracture: Secondary | ICD-10-CM | POA: Insufficient documentation

## 2015-07-17 DIAGNOSIS — R42 Dizziness and giddiness: Secondary | ICD-10-CM

## 2015-07-17 LAB — I-STAT CG4 LACTIC ACID, ED: Lactic Acid, Venous: 0.54 mmol/L (ref 0.5–2.0)

## 2015-07-17 MED ORDER — MECLIZINE HCL 25 MG PO TABS
25.0000 mg | ORAL_TABLET | Freq: Once | ORAL | Status: AC
  Administered 2015-07-17: 25 mg via ORAL
  Filled 2015-07-17: qty 1

## 2015-07-17 MED ORDER — MECLIZINE HCL 25 MG PO TABS: 25.0000 mg | ORAL_TABLET | Freq: Three times a day (TID) | ORAL | Status: DC | PRN
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
## 2015-07-17 NOTE — Discharge Instructions (Signed)
Vertigo °Vertigo means you feel like you or your surroundings are moving when they are not. Vertigo can be dangerous if it occurs when you are at work, driving, or performing difficult activities.  °CAUSES  °Vertigo occurs when there is a conflict of signals sent to your brain from the visual and sensory systems in your body. There are many different causes of vertigo, including: °· Infections, especially in the inner ear. °· A bad reaction to a drug or misuse of alcohol and medicines. °· Withdrawal from drugs or alcohol. °· Rapidly changing positions, such as lying down or rolling over in bed. °· A migraine headache. °· Decreased blood flow to the brain. °· Increased pressure in the brain from a head injury, infection, tumor, or bleeding. °SYMPTOMS  °You may feel as though the world is spinning around or you are falling to the ground. Because your balance is upset, vertigo can cause nausea and vomiting. You may have involuntary eye movements (nystagmus). °DIAGNOSIS  °Vertigo is usually diagnosed by physical exam. If the cause of your vertigo is unknown, your caregiver may perform imaging tests, such as an MRI scan (magnetic resonance imaging). °TREATMENT  °Most cases of vertigo resolve on their own, without treatment. Depending on the cause, your caregiver may prescribe certain medicines. If your vertigo is related to body position issues, your caregiver may recommend movements or procedures to correct the problem. In rare cases, if your vertigo is caused by certain inner ear problems, you may need surgery. °HOME CARE INSTRUCTIONS  °· Follow your caregiver's instructions. °· Avoid driving. °· Avoid operating heavy machinery. °· Avoid performing any tasks that would be dangerous to you or others during a vertigo episode. °· Tell your caregiver if you notice that certain medicines seem to be causing your vertigo. Some of the medicines used to treat vertigo episodes can actually make them worse in some people. °SEEK  IMMEDIATE MEDICAL CARE IF:  °· Your medicines do not relieve your vertigo or are making it worse. °· You develop problems with talking, walking, weakness, or using your arms, hands, or legs. °· You develop severe headaches. °· Your nausea or vomiting continues or gets worse. °· You develop visual changes. °· A family member notices behavioral changes. °· Your condition gets worse. °MAKE SURE YOU: °· Understand these instructions. °· Will watch your condition. °· Will get help right away if you are not doing well or get worse. °  °This information is not intended to replace advice given to you by your health care provider. Make sure you discuss any questions you have with your health care provider. °  °Document Released: 04/01/2005 Document Revised: 09/14/2011 Document Reviewed: 10/15/2014 °Elsevier Interactive Patient Education ©2016 Elsevier Inc. ° °Meclizine tablets or capsules °What is this medicine? °MECLIZINE (MEK li zeen) is an antihistamine. It is used to prevent nausea, vomiting, or dizziness caused by motion sickness. It is also used to prevent and treat vertigo (extreme dizziness or a feeling that you or your surroundings are tilting or spinning around). °This medicine may be used for other purposes; ask your health care provider or pharmacist if you have questions. °What should I tell my health care provider before I take this medicine? °They need to know if you have any of these conditions: °-glaucoma °-lung or breathing disease, like asthma °-problems urinating °-prostate disease °-stomach or intestine problems °-an unusual or allergic reaction to meclizine, other medicines, foods, dyes, or preservatives °-pregnant or trying to get pregnant °-breast-feeding °How should I   use this medicine? °Take this medicine by mouth with a glass of water. Follow the directions on the prescription label. If you are using this medicine to prevent motion sickness, take the dose at least 1 hour before travel. If it upsets  your stomach, take it with food or milk. Take your doses at regular intervals. Do not take your medicine more often than directed. °Talk to your pediatrician regarding the use of this medicine in children. Special care may be needed. °Overdosage: If you think you have taken too much of this medicine contact a poison control center or emergency room at once. °NOTE: This medicine is only for you. Do not share this medicine with others. °What if I miss a dose? °If you miss a dose, take it as soon as you can. If it is almost time for your next dose, take only that dose. Do not take double or extra doses. °What may interact with this medicine? °Do not take this medicine with any of the following medications: °-MAOIs like Carbex, Eldepryl, Marplan, Nardil, and Parnate °This medicine may also interact with the following medications: °-alcohol °-antihistamines for allergy, cough and cold °-certain medicines for anxiety or sleep °-certain medicines for depression, like amitriptyline, fluoxetine, sertraline °-certain medicines for seizures like phenobarbital, primidone °-general anesthetics like halothane, isoflurane, methoxyflurane, propofol °-local anesthetics like lidocaine, pramoxine, tetracaine °-medicines that relax muscles for surgery °-narcotic medicines for pain °-phenothiazines like chlorpromazine, mesoridazine, prochlorperazine, thioridazine °This list may not describe all possible interactions. Give your health care provider a list of all the medicines, herbs, non-prescription drugs, or dietary supplements you use. Also tell them if you smoke, drink alcohol, or use illegal drugs. Some items may interact with your medicine. °What should I watch for while using this medicine? °Tell your doctor or healthcare professional if your symptoms do not start to get better or if they get worse. °You may get drowsy or dizzy. Do not drive, use machinery, or do anything that needs mental alertness until you know how this  medicine affects you. Do not stand or sit up quickly, especially if you are an older patient. This reduces the risk of dizzy or fainting spells. Alcohol may interfere with the effect of this medicine. Avoid alcoholic drinks. °Your mouth may get dry. Chewing sugarless gum or sucking hard candy, and drinking plenty of water may help. Contact your doctor if the problem does not go away or is severe. °This medicine may cause dry eyes and blurred vision. If you wear contact lenses you may feel some discomfort. Lubricating drops may help. See your eye doctor if the problem does not go away or is severe. °What side effects may I notice from receiving this medicine? °Side effects that you should report to your doctor or health care professional as soon as possible: °-feeling faint or lightheaded, falls °-fast, irregular heartbeat °Side effects that usually do not require medical attention (report these to your doctor or health care professional if they continue or are bothersome): °-constipation °-headache °-trouble passing urine or change in the amount of urine °-trouble sleeping °-upset stomach °This list may not describe all possible side effects. Call your doctor for medical advice about side effects. You may report side effects to FDA at 1-800-FDA-1088. °Where should I keep my medicine? °Keep out of the reach of children. °Store at room temperature between 15 and 30 degrees C (59 and 86 degrees F). Keep container tightly closed. Throw away any unused medicine after the expiration date. °NOTE: This sheet   is a summary. It may not cover all possible information. If you have questions about this medicine, talk to your doctor, pharmacist, or health care provider. °  °© 2016, Elsevier/Gold Standard. (2014-12-27 09:20:57) ° °
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
## 2015-07-17 NOTE — ED Notes (Signed)
See assessment by Dr. Patria Maneampos
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
## 2015-07-18 ENCOUNTER — Encounter: Payer: Self-pay | Admitting: Physical Therapy

## 2015-07-20 ENCOUNTER — Emergency Department (HOSPITAL_COMMUNITY): Payer: Medicaid Other

## 2015-07-20 ENCOUNTER — Encounter (HOSPITAL_COMMUNITY): Payer: Self-pay | Admitting: Emergency Medicine

## 2015-07-20 ENCOUNTER — Emergency Department (HOSPITAL_COMMUNITY)
Admission: EM | Admit: 2015-07-20 | Discharge: 2015-07-24 | Payer: Medicaid Other | Attending: Emergency Medicine | Admitting: Emergency Medicine

## 2015-07-20 ENCOUNTER — Emergency Department (HOSPITAL_BASED_OUTPATIENT_CLINIC_OR_DEPARTMENT_OTHER)
Admit: 2015-07-20 | Discharge: 2015-07-20 | Disposition: A | Discharge status: Home / Self Care | Payer: Medicaid Other | Attending: Emergency Medicine | Admitting: Emergency Medicine

## 2015-07-20 DIAGNOSIS — M79605 Pain in left leg: Secondary | ICD-10-CM | POA: Diagnosis present

## 2015-07-20 DIAGNOSIS — R456 Violent behavior: Secondary | ICD-10-CM | POA: Diagnosis not present

## 2015-07-20 DIAGNOSIS — Z87828 Personal history of other (healed) physical injury and trauma: Secondary | ICD-10-CM | POA: Diagnosis not present

## 2015-07-20 DIAGNOSIS — M7989 Other specified soft tissue disorders: Secondary | ICD-10-CM

## 2015-07-20 DIAGNOSIS — Z8781 Personal history of (healed) traumatic fracture: Secondary | ICD-10-CM | POA: Diagnosis not present

## 2015-07-20 DIAGNOSIS — Z8673 Personal history of transient ischemic attack (TIA), and cerebral infarction without residual deficits: Secondary | ICD-10-CM | POA: Insufficient documentation

## 2015-07-20 DIAGNOSIS — F29 Unspecified psychosis not due to a substance or known physiological condition: Secondary | ICD-10-CM | POA: Insufficient documentation

## 2015-07-20 DIAGNOSIS — Z969 Presence of functional implant, unspecified: Secondary | ICD-10-CM | POA: Insufficient documentation

## 2015-07-20 DIAGNOSIS — D62 Acute posthemorrhagic anemia: Secondary | ICD-10-CM | POA: Diagnosis not present

## 2015-07-20 DIAGNOSIS — G9340 Encephalopathy, unspecified: Secondary | ICD-10-CM | POA: Diagnosis not present

## 2015-07-20 DIAGNOSIS — Z8619 Personal history of other infectious and parasitic diseases: Secondary | ICD-10-CM | POA: Insufficient documentation

## 2015-07-20 DIAGNOSIS — M79609 Pain in unspecified limb: Secondary | ICD-10-CM

## 2015-07-20 DIAGNOSIS — R45850 Homicidal ideations: Secondary | ICD-10-CM | POA: Diagnosis not present

## 2015-07-20 DIAGNOSIS — Y658 Other specified misadventures during surgical and medical care: Secondary | ICD-10-CM | POA: Diagnosis not present

## 2015-07-20 DIAGNOSIS — T84623D Infection and inflammatory reaction due to internal fixation device of left tibia, subsequent encounter: Secondary | ICD-10-CM

## 2015-07-20 DIAGNOSIS — D6489 Other specified anemias: Secondary | ICD-10-CM

## 2015-07-20 DIAGNOSIS — F39 Unspecified mood [affective] disorder: Secondary | ICD-10-CM | POA: Diagnosis not present

## 2015-07-20 LAB — COMPREHENSIVE METABOLIC PANEL
ALBUMIN: 3.5 g/dL (ref 3.5–5.0)
ALT: 13 U/L — ABNORMAL LOW (ref 17–63)
ANION GAP: 8 (ref 5–15)
AST: 28 U/L (ref 15–41)
Alkaline Phosphatase: 84 U/L (ref 38–126)
BUN: 5 mg/dL — AB (ref 6–20)
CHLORIDE: 105 mmol/L (ref 101–111)
CO2: 26 mmol/L (ref 22–32)
Calcium: 9.1 mg/dL (ref 8.9–10.3)
Creatinine, Ser: 0.78 mg/dL (ref 0.61–1.24)
GFR calc Af Amer: >60 mL/min (ref >60)
GLUCOSE: 84 mg/dL (ref 65–99)
POTASSIUM: 3.5 mmol/L (ref 3.5–5.1)
Sodium: 139 mmol/L (ref 135–145)
Total Bilirubin: 0.3 mg/dL (ref 0.3–1.2)
Total Protein: 7.2 g/dL (ref 6.5–8.1)

## 2015-07-20 LAB — URINALYSIS, ROUTINE W REFLEX MICROSCOPIC
Bilirubin Urine: NEGATIVE
Glucose, UA: NEGATIVE mg/dL
HGB URINE DIPSTICK: NEGATIVE
Ketones, ur: NEGATIVE mg/dL
LEUKOCYTES UA: NEGATIVE
Nitrite: NEGATIVE
PROTEIN: 30 mg/dL — AB
SPECIFIC GRAVITY, URINE: 1.025 (ref 1.005–1.030)
pH: 6 (ref 5.0–8.0)

## 2015-07-20 LAB — CBC WITH DIFFERENTIAL/PLATELET
BASOS ABS: 0.1 10*3/uL (ref 0.0–0.1)
Basophils Relative: 1 %
Eosinophils Absolute: 0.3 10*3/uL (ref 0.0–0.7)
Eosinophils Relative: 5 %
HCT: 29.2 % — ABNORMAL LOW (ref 39.0–52.0)
Hemoglobin: 8.1 g/dL — ABNORMAL LOW (ref 13.0–17.0)
LYMPHS ABS: 2.1 10*3/uL (ref 0.7–4.0)
Lymphocytes Relative: 41 %
MCH: 18.6 pg — ABNORMAL LOW (ref 26.0–34.0)
MCHC: 27.7 g/dL — AB (ref 30.0–36.0)
MCV: 67 fL — ABNORMAL LOW (ref 78.0–100.0)
MONO ABS: 0.5 10*3/uL (ref 0.1–1.0)
Monocytes Relative: 10 %
Neutro Abs: 2 10*3/uL (ref 1.7–7.7)
Neutrophils Relative %: 43 %
PLATELETS: 489 10*3/uL — AB (ref 150–400)
RBC: 4.36 MIL/uL (ref 4.22–5.81)
RDW: 17.1 % — AB (ref 11.5–15.5)
WBC: 5 10*3/uL (ref 4.0–10.5)

## 2015-07-20 LAB — URINE MICROSCOPIC-ADD ON: Bacteria, UA: NONE SEEN

## 2015-07-20 LAB — RAPID URINE DRUG SCREEN, HOSP PERFORMED
AMPHETAMINES: NOT DETECTED
BENZODIAZEPINES: NOT DETECTED
Barbiturates: NOT DETECTED
Cocaine: NOT DETECTED
OPIATES: NOT DETECTED
TETRAHYDROCANNABINOL: NOT DETECTED

## 2015-07-20 LAB — ACETAMINOPHEN LEVEL

## 2015-07-20 LAB — SALICYLATE LEVEL

## 2015-07-20 LAB — ETHANOL

## 2015-07-20 LAB — SEDIMENTATION RATE: SED RATE: 12 mm/h (ref 0–16)

## 2015-07-20 LAB — C-REACTIVE PROTEIN

## 2015-07-20 LAB — CBG MONITORING, ED: GLUCOSE-CAPILLARY: 73 mg/dL (ref 65–99)

## 2015-07-20 LAB — I-STAT CG4 LACTIC ACID, ED: LACTIC ACID, VENOUS: 0.77 mmol/L (ref 0.5–2.0)

## 2015-07-20 LAB — VITAMIN B12: VITAMIN B 12: 700 pg/mL (ref 180–914)

## 2015-07-20 LAB — TSH: TSH: 0.145 u[IU]/mL — ABNORMAL LOW (ref 0.350–4.500)

## 2015-07-20 LAB — AMMONIA: Ammonia: 53 umol/L — ABNORMAL HIGH (ref 9–35)

## 2015-07-20 MED ORDER — LORAZEPAM 1 MG PO TABS
1.0000 mg | ORAL_TABLET | Freq: Three times a day (TID) | ORAL | Status: DC | PRN
  Administered 2015-07-21 – 2015-07-24 (×3): 1 mg via ORAL
  Filled 2015-07-20 (×3): qty 1

## 2015-07-20 MED ORDER — GADOBENATE DIMEGLUMINE 529 MG/ML IV SOLN
10.0000 mL | Freq: Once | INTRAVENOUS | Status: AC | PRN
  Administered 2015-07-20: 10 mL via INTRAVENOUS

## 2015-07-20 MED ORDER — SODIUM CHLORIDE 0.9 % IV BOLUS (SEPSIS)
1000.0000 mL | Freq: Once | INTRAVENOUS | Status: AC
  Administered 2015-07-20: 1000 mL via INTRAVENOUS

## 2015-07-20 MED ORDER — FENTANYL CITRATE (PF) 100 MCG/2ML IJ SOLN
50.0000 ug | Freq: Once | INTRAMUSCULAR | Status: AC
  Administered 2015-07-20: 50 ug via INTRAVENOUS
  Filled 2015-07-20: qty 2

## 2015-07-20 MED ORDER — ZOLPIDEM TARTRATE 5 MG PO TABS
5.0000 mg | ORAL_TABLET | Freq: Every evening | ORAL | Status: DC | PRN
  Administered 2015-07-21 – 2015-07-23 (×3): 5 mg via ORAL
  Filled 2015-07-20 (×3): qty 1

## 2015-07-20 MED ORDER — OXYCODONE HCL 5 MG PO TABS
5.0000 mg | ORAL_TABLET | ORAL | Status: DC | PRN
Start: 2015-07-20 — End: 2015-07-24
  Administered 2015-07-23 – 2015-07-24 (×4): 5 mg via ORAL
  Filled 2015-07-20 (×4): qty 1

## 2015-07-20 MED ORDER — MECLIZINE HCL 25 MG PO TABS: 25.0000 mg | ORAL_TABLET | Freq: Three times a day (TID) | ORAL | Status: DC | PRN

## 2015-07-20 MED ORDER — IBUPROFEN 200 MG PO TABS
600.0000 mg | ORAL_TABLET | Freq: Three times a day (TID) | ORAL | Status: DC | PRN
  Administered 2015-07-23: 600 mg via ORAL
  Filled 2015-07-20: qty 3

## 2015-07-20 MED ORDER — ONDANSETRON HCL 4 MG/2ML IJ SOLN
4.0000 mg | Freq: Once | INTRAMUSCULAR | Status: AC
  Administered 2015-07-20: 4 mg via INTRAVENOUS
  Filled 2015-07-20: qty 2

## 2015-07-20 MED ORDER — DOCUSATE SODIUM 100 MG PO CAPS
100.0000 mg | ORAL_CAPSULE | Freq: Two times a day (BID) | ORAL | Status: DC
  Administered 2015-07-22 – 2015-07-24 (×4): 100 mg via ORAL
  Filled 2015-07-20 (×4): qty 1

## 2015-07-20 MED ORDER — ENOXAPARIN SODIUM 40 MG/0.4ML ~~LOC~~ SOLN: 40.0000 mg | SUBCUTANEOUS | Status: DC

## 2015-07-20 MED ORDER — NORTRIPTYLINE HCL 25 MG PO CAPS: 25.0000 mg | ORAL_CAPSULE | Freq: Every day | ORAL | Status: DC

## 2015-07-20 MED ORDER — ALUM & MAG HYDROXIDE-SIMETH 200-200-20 MG/5ML PO SUSP: 30.0000 mL | ORAL | Status: DC | PRN

## 2015-07-20 MED ORDER — ONDANSETRON HCL 4 MG PO TABS
4.0000 mg | ORAL_TABLET | Freq: Three times a day (TID) | ORAL | Status: DC | PRN
  Administered 2015-07-23: 4 mg via ORAL
  Filled 2015-07-20: qty 1

## 2015-07-20 MED ORDER — LEVOFLOXACIN 750 MG PO TABS
750.0000 mg | ORAL_TABLET | Freq: Every day | ORAL | Status: DC
  Administered 2015-07-21 – 2015-07-24 (×4): 750 mg via ORAL
  Filled 2015-07-20 (×5): qty 1

## 2015-07-20 NOTE — ED Notes (Signed)
Telepsych complete.  Changing into scrubs at this time.
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
## 2015-07-20 NOTE — ED Notes (Signed)
telepsych  

## 2015-07-20 NOTE — Progress Notes (Addendum)
VASCULAR LAB PRELIMINARY  PRELIMINARY  PRELIMINARY  PRELIMINARY  Left lower extremity venous duplex completed.    Preliminary report:  There is no DVT or SVT noted in the left lower extremity.  There is significant interstitial fluid noted throughout the calf.   Jeremiah [name], RVT 07/20/2015, 6:02 PM
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
## 2015-07-20 NOTE — ED Notes (Signed)
Neurology in to see patient.

## 2015-07-20 NOTE — ED Notes (Signed)
Pt makes very little eye contact.  Patient is asking when he will be able to go home.  This RN informed him he will be with us through his reassessment tomorrow.  Patient offered food and drink.  Pt requested a sprite, but no food.  Pt provided with sprite.  Pt currently has his head buried under his blankets and his brother is saying his "goodbyes".  Brother made aware of visiting hours and patient's expected POC.  Both verbalized understanding.

## 2015-07-20 NOTE — ED Provider Notes (Addendum)
Psychiatry has evaluated the patient.  They request a neurology consult considering his medical problems and history of prior stroke before recommending any further psychiatric treatment.  I will consult neuro hospitalist on call.  Linwood DibblesJon Weslee Fogg, MD 07/20/15 2024  Pt was assessed by Dr Hosie PoissonSumner.  Will get an MRI of the brain this evening.  Also add on some additional labs.  Anticipate this will be negative and if it is at that point clear for psychiatry.  Linwood DibblesJon Maylie Ashton, MD 07/20/15 2141
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
## 2015-07-21 LAB — IRON AND TIBC
IRON: 13 ug/dL — AB (ref 45–182)
SATURATION RATIOS: 4 % — AB (ref 17.9–39.5)
TIBC: 364 ug/dL (ref 250–450)
UIBC: 351 ug/dL

## 2015-07-21 LAB — FERRITIN: Ferritin: 29 ng/mL (ref 24–336)

## 2015-07-21 LAB — T4, FREE: FREE T4: 0.93 ng/dL (ref 0.61–1.12)

## 2015-07-21 LAB — HIV ANTIBODY (ROUTINE TESTING W REFLEX): HIV Screen 4th Generation wRfx: NONREACTIVE

## 2015-07-21 NOTE — ED Notes (Signed)
Pt lying on stretcher. Brother and sitter at bedside. Brother assisting w/interpreting d/t pt speaks minimal [place].

## 2015-07-21 NOTE — ED Notes (Signed)
Pt's family at beside advising pt has dr appt w/PCP tomorrow at 2:45pm and asking if they should cancel appt - advised them pt is IVC'd and dr's are still in decision process of whether pt is medially cleared to go to psych facility. Advised them no plan has been mentioned for pt to be d/c'd and recommended they consult w/staff in am re: of need to cancel appt. Voiced appreciation and understanding.

## 2015-07-21 NOTE — ED Notes (Signed)
Lab to add-on t4
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
## 2015-07-21 NOTE — ED Notes (Signed)
Pt moved from stretcher to bed for comfort.

## 2015-07-21 NOTE — ED Notes (Signed)
[name] Pt will not have TTS consult today b/c pt does require inpatient psych treatment.

## 2015-07-21 NOTE — ED Notes (Signed)
Called Lab to check status of ANA; Lab informed RN that ANA is a send out test and my take at least 24 hours

## 2015-07-21 NOTE — ED Notes (Signed)
Original IVC paperwork located in folder for magistrate, copy sent to medical records, [facility] and 4 served copies on clipboard.

## 2015-07-21 NOTE — ED Notes (Addendum)
Pt initially refusing at have bloodwork drawn. Brother and sitter encourage pt to comply. Security arrived - pt then cooperative w/verbal encouragement. Family brought food in pluggable containers for pt and the 2 family members who are w/pt. Security assisted w/advisng not able to have this in room w/pt - voiced understanding and placed items w/security.

## 2015-07-21 NOTE — ED Notes (Signed)
Pt went into bathroom, locked the door and turned the shower on. Pt refused to unlock and open door. RN able to open door by pressing latch. Pt ambulated from bathroom back to room - hopping on his right leg. Pt then laid down on bed. Brother remains at bedside.

## 2015-07-21 NOTE — ED Notes (Signed)
Pt provided with drink and patient's family member is feeding him in room.

## 2015-07-21 NOTE — ED Notes (Signed)
Patient standing outside room, stating he is going to leave. Security present, MD notified. MD explaining to patient that he is required, by law, to stay. Patient now back in bed, listening to MD, cooperative at this time.

## 2015-07-21 NOTE — ED Notes (Signed)
Called lab to follow up on ANA.  Processing.

## 2015-07-21 NOTE — ED Notes (Signed)
Dr in w/pt.  

## 2015-07-21 NOTE — ED Provider Notes (Signed)
9:49 AM I have reviewed the patient's labs. D/w Dr. 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 Ammonia mildly elevated, unclear why but patient is not acting encephalopathic. Patient's TSH is concerning for possible hyperthyroidism. Will add on T4. I have consulted family practice, his PCP, for a consult for possible medication adjustments. Likely his symptoms are still from primary psych issues but thyroid could be contributing.  Pricilla LovelessScott Weston Fulco, MD 07/21/15 [id number]

## 2015-07-22 ENCOUNTER — Ambulatory Visit: Payer: Self-pay | Admitting: Family Medicine

## 2015-07-22 DIAGNOSIS — F39 Unspecified mood [affective] disorder: Secondary | ICD-10-CM | POA: Diagnosis not present

## 2015-07-22 LAB — T3, FREE: T3 FREE: 3 pg/mL (ref 2.0–4.4)

## 2015-07-22 LAB — ANTINUCLEAR ANTIBODIES, IFA: ANTINUCLEAR ANTIBODIES, IFA: NEGATIVE

## 2015-07-22 LAB — RPR: RPR: NONREACTIVE

## 2015-07-22 MED ORDER — HALOPERIDOL LACTATE 5 MG/ML IJ SOLN
INTRAMUSCULAR | Status: AC
  Administered 2015-07-22: 5 mg via INTRAMUSCULAR
  Filled 2015-07-22: qty 1

## 2015-07-22 MED ORDER — HALOPERIDOL LACTATE 5 MG/ML IJ SOLN
5.0000 mg | Freq: Once | INTRAMUSCULAR | Status: AC
  Administered 2015-07-22: 5 mg via INTRAMUSCULAR

## 2015-07-22 MED ORDER — QUETIAPINE FUMARATE 25 MG PO TABS
25.0000 mg | ORAL_TABLET | Freq: Two times a day (BID) | ORAL | Status: DC
  Administered 2015-07-22 – 2015-07-24 (×4): 25 mg via ORAL
  Filled 2015-07-22 (×4): qty 1

## 2015-07-22 MED ORDER — ZIPRASIDONE MESYLATE 20 MG IM SOLR: 10.0000 mg | INTRAMUSCULAR | Status: DC | PRN

## 2015-07-22 MED ORDER — HALOPERIDOL LACTATE 5 MG/ML IJ SOLN
2.0000 mg | Freq: Once | INTRAMUSCULAR | Status: AC
  Administered 2015-07-22: 2 mg via INTRAMUSCULAR
  Filled 2015-07-22: qty 1

## 2015-07-22 NOTE — Progress Notes (Signed)
CSW engaged with Patient at his bedside along with Psychiatrist Dr. Elsie SaasJonnalagadda. Patient presented with minimal communication. Patient reported that he wanted to go home to [place]. He notes that his father, mother, and grandfather live there. Patient reports "no eat" and refused to eat his food at this time. Patient proceeded to cover face and body with blanket. Patient does note that he speaks Swahili.   Per Dr. Elsie SaasJonnalagadda and [facility], Inetta Fermoina, [facility] becomes available. CSW will continue to follow for disposition.   Noe GensAshley Gardner, Theresia MajorsLCSWA [facility] ED Clinical Social Worker [phone]

## 2015-07-22 NOTE — ED Notes (Signed)
EDP at bedside assessing pt, will place order for Haldol.

## 2015-07-22 NOTE — ED Notes (Signed)
Engineer, miningCalled Charge RN at [facility] and requested pt to be moved to lock down part.  Charge stated that she would call this RN when a room becomes available.

## 2015-07-22 NOTE — ED Notes (Signed)
Patient got in shower with scrubs and socks on. Patient preceded to get in bed with wet scrubs on. Patient given dry scrubs and socks. Patient bed linen changed also. Encouragement and support provided and safety maintain. Q 15 min safety checks in place.

## 2015-07-22 NOTE — ED Notes (Signed)
Patient continues to hop over to the door and shout at security and nurses "go home!" Security and [employer] remains at bedside.

## 2015-07-22 NOTE — ED Notes (Signed)
Lyndzie RN from [facility] called and pt has been accepted to the lock down unit due to constantly trying to run away.

## 2015-07-22 NOTE — ED Notes (Signed)
Patient continues to hop to exit doors trying to find a way to escape. Writer explains to patient that he can not leave at this time. [facility] and ER staff alerted to patient high risk of elopement. Encouragement and support provided and safety maintain. Q 15 min safety checks remain in place.
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
## 2015-07-22 NOTE — ED Notes (Signed)
Pt hopped to restroom and refused to go back to his room.  RN talked with pt and he allowed RN to push him in a wheel chair back to his room.
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
## 2015-07-22 NOTE — ED Notes (Signed)
Spoke with EDP and Consulting civil engineerCharge RN, brother needs to be escorted out and instructed to return at visiting hours. Security escorting brother out

## 2015-07-22 NOTE — ED Notes (Signed)
Pt continues to try to run down the hallway to escape.  Security and this RN took pt back to his room.

## 2015-07-22 NOTE — ED Notes (Signed)
Spoke with EDP, stated that if pt keeps getting up to admin 5mg  Haldol IM.

## 2015-07-22 NOTE — ED Notes (Signed)
Pt now on floor crying and screaming, Security and [employer] at bedside
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
## 2015-07-22 NOTE — ED Notes (Signed)
[facility] Transporting [facility]. Pt left in a stable mood in a wheelchair.

## 2015-07-22 NOTE — ED Notes (Signed)
Patient remains asleep, do not want to wake up at this time for vital signs.

## 2015-07-22 NOTE — ED Notes (Signed)
Called [facility] to [facility] to [facility] due to being [facility]C'd.

## 2015-07-22 NOTE — ED Notes (Signed)
[id number] Expected date:  Expected time:  Means of arrival:  [facility]

## 2015-07-22 NOTE — ED Notes (Signed)
Cheese pizza and fries ordered for patient for lunch. And a snack and drink placed at bedside.

## 2015-07-23 ENCOUNTER — Encounter: Payer: Self-pay | Admitting: Physical Therapy

## 2015-07-23 DIAGNOSIS — R45850 Homicidal ideations: Secondary | ICD-10-CM

## 2015-07-23 DIAGNOSIS — F29 Unspecified psychosis not due to a substance or known physiological condition: Secondary | ICD-10-CM | POA: Diagnosis not present

## 2015-07-23 DIAGNOSIS — F39 Unspecified mood [affective] disorder: Secondary | ICD-10-CM

## 2015-07-23 LAB — VITAMIN B1: Vitamin B1 (Thiamine): 105.4 nmol/L (ref 66.5–200.0)
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
## 2015-07-23 NOTE — Progress Notes (Signed)
Referral was followed up at: [facility] stated "Not at the office, probably declined because patient is not on the board." Earlene Plater - at [facility], "Follow up in am". [facility] - per Zollie Scale, "Still going through referrals." [facility] - going through referrals. [place] - intake states that "They tried to call you but no answer, please follow up in am for available beds". [place] - per Dannielle Huh, referral is being currently reviewed.  Declined at: [facility] -  [facility] Cant[facility] - due aggression  Melbourne Abts, [place] Disposition staff 07/23/2015 8:54 PM
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
## 2015-07-23 NOTE — BH Assessment (Addendum)
BHH Assessment Progress Note  The following facilities have been contacted to seek placement for this pt, with results as noted:  Beds available, information sent, decision pending:  [facility] [place]   Declined:  [facility] (due to aggression)   At capacity:  [facility] [place]   [place], [place] Triage Specialist [phone]

## 2015-07-23 NOTE — ED Notes (Signed)
This nurse, NP and MD at bedside with interpretor on phone.

## 2015-07-23 NOTE — ED Notes (Signed)
Patient continues to state that he wants to leave. Patient informed that he has to stay until the doctor clears him or until placement is found. Patient given applesauce. Encouragement and support provided and safety maintain. Q 15 min safety checks remain in place.

## 2015-07-23 NOTE — ED Notes (Signed)
Interpretor called. Pt reports he wants to go back to [name] and that his family has denied him. Will continue to monitor.

## 2015-07-23 NOTE — ED Notes (Signed)
Pt sitting at bed room door, requesting "shoes" to "go to school"

## 2015-07-23 NOTE — ED Notes (Signed)
Chart in error

## 2015-07-23 NOTE — ED Notes (Signed)
CIWA charted on wrong patient.

## 2015-07-23 NOTE — ED Notes (Signed)
Assessment questions asked via phone interpretor, MD and NP present. Pt endorsing SI/HI. Pt presents with increased agitation. Threatening interpretor on phone. Reports he wants to go back to [name]. Pt reports he is hearing voices and speaks of a demon. Pt presents with paranoia. C/O pain in left leg 5/10. Compliant with medication regimen. Will continue to monitor.

## 2015-07-24 ENCOUNTER — Inpatient Hospital Stay
Admission: EM | Admit: 2015-07-24 | Discharge: 2015-07-29 | DRG: 885 | Disposition: A | Discharge status: Home / Self Care | Payer: Medicaid Other | Source: Intra-hospital | Attending: Psychiatry | Admitting: Psychiatry

## 2015-07-24 DIAGNOSIS — G43709 Chronic migraine without aura, not intractable, without status migrainosus: Secondary | ICD-10-CM | POA: Diagnosis present

## 2015-07-24 DIAGNOSIS — S82409B Unspecified fracture of shaft of unspecified fibula, initial encounter for open fracture type I or II: Secondary | ICD-10-CM | POA: Diagnosis present

## 2015-07-24 DIAGNOSIS — T84623A Infection and inflammatory reaction due to internal fixation device of left tibia, initial encounter: Secondary | ICD-10-CM | POA: Diagnosis present

## 2015-07-24 DIAGNOSIS — Z8673 Personal history of transient ischemic attack (TIA), and cerebral infarction without residual deficits: Secondary | ICD-10-CM

## 2015-07-24 DIAGNOSIS — R6520 Severe sepsis without septic shock: Secondary | ICD-10-CM

## 2015-07-24 DIAGNOSIS — A419 Sepsis, unspecified organism: Secondary | ICD-10-CM | POA: Diagnosis present

## 2015-07-24 DIAGNOSIS — F23 Brief psychotic disorder: Secondary | ICD-10-CM | POA: Diagnosis present

## 2015-07-24 DIAGNOSIS — Z9889 Other specified postprocedural states: Secondary | ICD-10-CM

## 2015-07-24 DIAGNOSIS — R451 Restlessness and agitation: Secondary | ICD-10-CM | POA: Diagnosis present

## 2015-07-24 DIAGNOSIS — S82209B Unspecified fracture of shaft of unspecified tibia, initial encounter for open fracture type I or II: Secondary | ICD-10-CM | POA: Diagnosis present

## 2015-07-24 DIAGNOSIS — B9689 Other specified bacterial agents as the cause of diseases classified elsewhere: Secondary | ICD-10-CM | POA: Diagnosis present

## 2015-07-24 DIAGNOSIS — M869 Osteomyelitis, unspecified: Secondary | ICD-10-CM

## 2015-07-24 DIAGNOSIS — F29 Unspecified psychosis not due to a substance or known physiological condition: Principal | ICD-10-CM | POA: Diagnosis present

## 2015-07-24 DIAGNOSIS — K5900 Constipation, unspecified: Secondary | ICD-10-CM | POA: Diagnosis present

## 2015-07-24 DIAGNOSIS — E8881 Metabolic syndrome: Secondary | ICD-10-CM | POA: Diagnosis present

## 2015-07-24 DIAGNOSIS — IMO0002 Reserved for concepts with insufficient information to code with codable children: Secondary | ICD-10-CM | POA: Diagnosis present

## 2015-07-24 DIAGNOSIS — I639 Cerebral infarction, unspecified: Secondary | ICD-10-CM

## 2015-07-24 MED ORDER — MECLIZINE HCL 25 MG PO TABS: 25.0000 mg | ORAL_TABLET | Freq: Three times a day (TID) | ORAL | Status: DC | PRN

## 2015-07-24 MED ORDER — HALOPERIDOL LACTATE 5 MG/ML IJ SOLN
5.0000 mg | Freq: Three times a day (TID) | INTRAMUSCULAR | Status: DC | PRN
  Administered 2015-07-26: 5 mg via INTRAMUSCULAR
  Filled 2015-07-24: qty 1

## 2015-07-24 MED ORDER — DIPHENHYDRAMINE HCL 50 MG/ML IJ SOLN
50.0000 mg | Freq: Three times a day (TID) | INTRAMUSCULAR | Status: DC | PRN
  Administered 2015-07-26: 50 mg via INTRAMUSCULAR
  Filled 2015-07-24: qty 1

## 2015-07-24 MED ORDER — ALUM & MAG HYDROXIDE-SIMETH 200-200-20 MG/5ML PO SUSP: 30.0000 mL | ORAL | Status: DC | PRN

## 2015-07-24 MED ORDER — MAGNESIUM HYDROXIDE 400 MG/5ML PO SUSP: 30.0000 mL | Freq: Every day | ORAL | Status: DC | PRN

## 2015-07-24 MED ORDER — DOCUSATE SODIUM 100 MG PO CAPS
100.0000 mg | ORAL_CAPSULE | Freq: Two times a day (BID) | ORAL | Status: DC
  Administered 2015-07-24: 100 mg via ORAL
  Filled 2015-07-24: qty 1

## 2015-07-24 MED ORDER — DIPHENHYDRAMINE HCL 25 MG PO CAPS
50.0000 mg | ORAL_CAPSULE | Freq: Three times a day (TID) | ORAL | Status: DC | PRN
  Administered 2015-07-25: 50 mg via ORAL
  Filled 2015-07-24 (×3): qty 2

## 2015-07-24 MED ORDER — ACETAMINOPHEN 325 MG PO TABS
650.0000 mg | ORAL_TABLET | Freq: Four times a day (QID) | ORAL | Status: DC | PRN
  Administered 2015-07-24: 650 mg via ORAL
  Filled 2015-07-24: qty 2

## 2015-07-24 MED ORDER — LORAZEPAM 2 MG PO TABS
2.0000 mg | ORAL_TABLET | Freq: Three times a day (TID) | ORAL | Status: DC | PRN
  Administered 2015-07-26: 2 mg via ORAL
  Filled 2015-07-24: qty 1

## 2015-07-24 MED ORDER — LEVOFLOXACIN 750 MG PO TABS
750.0000 mg | ORAL_TABLET | Freq: Every day | ORAL | Status: DC
  Administered 2015-07-25 – 2015-07-26 (×2): 750 mg via ORAL
  Filled 2015-07-24 (×2): qty 1

## 2015-07-24 MED ORDER — HALOPERIDOL 5 MG PO TABS
5.0000 mg | ORAL_TABLET | Freq: Three times a day (TID) | ORAL | Status: DC | PRN
  Administered 2015-07-25: 5 mg via ORAL
  Filled 2015-07-24: qty 1

## 2015-07-24 MED ORDER — TRAZODONE HCL 100 MG PO TABS: 100.0000 mg | ORAL_TABLET | Freq: Every evening | ORAL | Status: DC | PRN

## 2015-07-24 MED ORDER — LORAZEPAM 2 MG/ML IJ SOLN
2.0000 mg | Freq: Three times a day (TID) | INTRAMUSCULAR | Status: DC | PRN
  Administered 2015-07-26: 2 mg via INTRAMUSCULAR
  Filled 2015-07-24: qty 1

## 2015-07-24 MED ORDER — NAPROXEN 500 MG PO TABS
250.0000 mg | ORAL_TABLET | Freq: Two times a day (BID) | ORAL | Status: DC
  Administered 2015-07-24: 250 mg via ORAL
  Filled 2015-07-24: qty 1

## 2015-07-24 NOTE — BH Assessment (Signed)
Patient has been accepted to [facility].  Accepting physician is Dr. Ardyth Harps.  Attending Physician will be Dr. Adria Devon.  Patient has been assigned to room 323, by [facility] Charge Nurse [place].  Call report to [phone].  Representative/Transfer Coordinator is Yvette Loveless.  [facility] ER Staff Julieanne Cotton, NP) made aware of acceptance.

## 2015-07-24 NOTE — Progress Notes (Signed)
Pt is restless, observed pacing in halls at intervals during shift. Uncooperative with care earlier in the shift. Observed crying and rolling on floor demanding D/C home. Pt denied AVH, SI, HI when assessed. Ambulatory with a limp, no fall activity to report thus far this shift. Medicated for c/o left leg pain and anxiety as per current orders. Compliant with medications when offer. Continue support, availability and encouragement provided to pt. Safety maintained on Q 15 minutes checks as ordered.
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
## 2015-07-24 NOTE — BH Assessment (Signed)
BHH Assessment Progress Note  The following facilities have been contacted to seek placement for this pt, with results as noted:  Beds available, information sent, decision pending:  [place]   Declined:  [facility] (due to medical and behavioral acuity) [facility] (due to behavioral acuity)   At capacity:  [facility]   [place], [place] Triage Specialist [phone]

## 2015-07-24 NOTE — ED Notes (Signed)
Patient went to room 41 and was knocking on patient's door while patient was having a tele-assessment. Patient in room 41 came out in a calm voice and told this patient not to knock on his door anymore. Writer informed patient in 1 that patient ment no harm and that he was going back to his room. Patient in 64 states  " I don;t care I just want him to stay away from my door". Writer escorted this patient back to his room. Encouragement and support provided and safety maintain. Q 15 min safety checks remain in place.

## 2015-07-25 ENCOUNTER — Encounter: Payer: Self-pay | Admitting: Physical Therapy

## 2015-07-25 DIAGNOSIS — M869 Osteomyelitis, unspecified: Secondary | ICD-10-CM

## 2015-07-25 DIAGNOSIS — I639 Cerebral infarction, unspecified: Secondary | ICD-10-CM

## 2015-07-25 DIAGNOSIS — F29 Unspecified psychosis not due to a substance or known physiological condition: Principal | ICD-10-CM

## 2015-07-25 DIAGNOSIS — F23 Brief psychotic disorder: Secondary | ICD-10-CM | POA: Diagnosis present

## 2015-07-25 LAB — LIPID PANEL
Cholesterol: 144 mg/dL (ref 0–200)
HDL: 65 mg/dL (ref >40)
LDL CALC: 60 mg/dL (ref 0–99)
Total CHOL/HDL Ratio: 2.2 RATIO
Triglycerides: 97 mg/dL (ref <150)
VLDL: 19 mg/dL (ref 0–40)

## 2015-07-25 LAB — TSH: TSH: 0.488 u[IU]/mL (ref 0.350–4.500)

## 2015-07-25 LAB — HEMOGLOBIN A1C: Hgb A1c MFr Bld: 5.8 % (ref 4.0–6.0)

## 2015-07-25 MED ORDER — NORTRIPTYLINE HCL 25 MG PO CAPS
25.0000 mg | ORAL_CAPSULE | Freq: Every day | ORAL | Status: DC
  Administered 2015-07-25 – 2015-07-28 (×4): 25 mg via ORAL
  Filled 2015-07-25 (×6): qty 1

## 2015-07-25 MED ORDER — PANTOPRAZOLE SODIUM 40 MG PO TBEC
40.0000 mg | DELAYED_RELEASE_TABLET | Freq: Every day | ORAL | Status: DC
  Administered 2015-07-25 – 2015-07-28 (×4): 40 mg via ORAL
  Filled 2015-07-25 (×4): qty 1

## 2015-07-25 MED ORDER — TRAMADOL HCL 50 MG PO TABS
50.0000 mg | ORAL_TABLET | Freq: Three times a day (TID) | ORAL | Status: DC | PRN
  Administered 2015-07-25 – 2015-07-26 (×2): 50 mg via ORAL
  Filled 2015-07-25 (×2): qty 1

## 2015-07-25 MED ORDER — RISPERIDONE 1 MG PO TABS
2.0000 mg | ORAL_TABLET | Freq: Every day | ORAL | Status: DC
  Administered 2015-07-25: 2 mg via ORAL
  Filled 2015-07-25: qty 2

## 2015-07-25 MED ORDER — DOCUSATE SODIUM 100 MG PO CAPS
200.0000 mg | ORAL_CAPSULE | Freq: Two times a day (BID) | ORAL | Status: DC
  Administered 2015-07-25 – 2015-07-29 (×8): 200 mg via ORAL
  Filled 2015-07-25 (×8): qty 2

## 2015-07-25 MED ORDER — ASPIRIN EC 81 MG PO TBEC
81.0000 mg | DELAYED_RELEASE_TABLET | Freq: Every day | ORAL | Status: DC
  Administered 2015-07-25 – 2015-07-29 (×5): 81 mg via ORAL
  Filled 2015-07-25 (×5): qty 1

## 2015-07-25 MED ORDER — RISPERIDONE 1 MG PO TABS
1.0000 mg | ORAL_TABLET | Freq: Every day | ORAL | Status: DC
  Administered 2015-07-25 – 2015-07-26 (×2): 1 mg via ORAL
  Filled 2015-07-25 (×2): qty 1

## 2015-07-25 MED ORDER — NAPROXEN 500 MG PO TABS: 250.0000 mg | ORAL_TABLET | Freq: Two times a day (BID) | ORAL | Status: DC | PRN
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
## 2015-07-25 NOTE — BHH Suicide Risk Assessment (Signed)
[facility] Admission Suicide Risk Assessment   Nursing information obtained from:    Demographic factors:    Current Mental Status:    Loss Factors:    Historical Factors:    Risk Reduction Factors:     Total Time spent with patient: 1 hour Principal Problem: Psychosis Diagnosis:   Patient Active Problem List   Diagnosis Date Noted  . Psychosis [F29] 07/25/2015  . Cerebrovascular accident (CVA) [facility]) old left caudate and putaminal infart [I63.9] 07/25/2015  . 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  s/p Severe sepsis  with respiratory failure [facility]) December 2016 [A41.9, R65.20] 07/03/2015  . Chronic migraine [G43.709] 05/03/2015  .  s/p Open fracture of tibia and fibula (October 2016) [S82.90XB] 04/16/2015   Subjective Data: see admission  Continued Clinical Symptoms:  Alcohol Use Disorder Identification Test Final Score (AUDIT): 0 The "Alcohol Use Disorders Identification Test", Guidelines for Use in Primary Care, Second Edition.  World Science writer [facility]). Score between 0-7:  no or low risk or alcohol related problems. Score between 8-15:  moderate risk of alcohol related problems. Score between 16-19:  high risk of alcohol related problems. Score 20 or above:  warrants further diagnostic evaluation for alcohol dependence and treatment.   CLINICAL FACTORS:   Currently Psychotic Medical Diagnoses and Treatments/Surgeries    Psychiatric Specialty Exam: ROS   COGNITIVE FEATURES THAT CONTRIBUTE TO RISK:  Closed-mindedness    SUICIDE RISK:   Mild:  Suicidal ideation of limited frequency, intensity, duration, and specificity.  There are no identifiable plans, no associated intent, mild dysphoria and related symptoms, good self-control (both objective and subjective assessment), few other risk factors, and identifiable protective factors, including available and accessible social support.  PLAN OF CARE: admit to [facility]  I  certify that inpatient services furnished can reasonably be expected to improve the patient's condition.   Jimmy Footman, MD 07/25/2015, 11:37 AM
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
## 2015-07-25 NOTE — Plan of Care (Signed)
Problem: Consults Goal: [facility] Treatment Patient Education Outcome: Progressing Patient able to verbalize some basic needs to Clinical research associate. [place] interpretor used for assessment. Review current plan of care with patient.
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
## 2015-07-25 NOTE — [facility] Group Notes (Signed)
[facility] Group Notes:  (Nursing/MHT/Case Management/Adjunct)  Date:  07/25/2015  Time:  1:50 PM  Type of Therapy:  Group Therapy  Participation Level:  Minimal  Participation Quality:  Inattentive  Affect:  Flat  Cognitive:  Alert  Insight:  Improving  Engagement in Group:  Limited  Modes of Intervention:  Activity  Summary of Progress/Problems:  Jeremiah [name] 07/25/2015, 1:50 PM
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
## 2015-07-25 NOTE — Progress Notes (Signed)
Recreation Therapy Notes  Date: 01.19.17 Time: 3:00 pm Location: Craft Room  Group Topic: Leisure Education  Goal Area(s) Addresses:  Patient will identify activities for each letter of the alphabet. Patient will verbalize ability to integrate positive leisure into life post d/c. Patient will verbalize ability to use leisure as a Associate Professor.  Behavioral Response: Did not attend   Intervention: Leisure Alphabet  Activity: Patients were given a Leisure Information systems manager and instructed to list healthy leisure activities for each letter of the alphabet.  Education: LRT educated patients on what they needed to participate in leisure.  Education Outcome: Patient did not attend group.  Clinical Observations/Feedback: Patient did not attend group.  Jacquelynn Cree, LRT/CTRS 07/25/2015 4:24 PM

## 2015-07-25 NOTE — [facility] Group Notes (Signed)
[facility] LCSW Group Therapy  07/25/2015 12:45 PM  Type of Therapy:  Group Therapy  Participation Level:  Did Not Attend   Modes of Intervention:  Discussion, Education, Socialization and Support  Summary of Progress/Problems: Emotional Regulation: Patients will identify both negative and positive emotions. They will discuss emotions they have difficulty regulating and how they impact their lives. Patients will be asked to identify healthy coping skills to combat unhealthy reactions to negative emotions.     Omar Gayden L Maveryk Renstrom MSW, LCSWA  07/25/2015, 12:45 PM   

## 2015-07-25 NOTE — [facility] Suicide Risk Assessment (Signed)
[facility] INPATIENT:  Family/Significant Other Suicide Prevention Education  Suicide Prevention Education:  Education Completed; with the pt's brother at ph: [phone],  (name of family member/significant other) has been identified by the patient as the family member/significant other with whom the patient will be residing, and identified as the person(s) who will aid the patient in the event of a mental health crisis (suicidal ideations/suicide attempt).  With written consent from the patient, the family member/significant other has been provided the following suicide prevention education, prior to the and/or following the discharge of the patient.   The suicide prevention education provided includes the following:  Suicide risk factors  Suicide prevention and interventions  National Suicide Hotline telephone number  [facility] assessment telephone number  [facility] Emergency Assistance 911  [facility] and/or Residential Mobile Crisis Unit telephone number  Request made of family/significant other to:  Remove weapons (e.g., guns, rifles, knives), all items previously/currently identified as safety concern.    Remove drugs/medications (over-the-counter, prescriptions, illicit drugs), all items previously/currently identified as a safety concern.  The family member/significant other verbalizes understanding of the suicide prevention education information provided.  The family member/significant other agrees to remove the items of safety concern listed above.  Dorothe Pea Ashtian Villacis 07/25/2015, 11:28 AM
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
## 2015-07-25 NOTE — Consult Note (Signed)
ORTHOPAEDICS:  Notes and current labs reviewed.  I spoke with Dr. Marcial Pacas Murphy's office. The patient was referred to Dr. Barbra Sarks at [facility] for additional evaluation and treatment. His appointment is scheduled for 07/30/2015 at 10:30am.  I was unable to interview or examine the patient since a Swahili interpreter was not available via the [facility] Language Line services. I will try again tomorrow morning.  Leeasia Secrist P. Angie Fava M.D.
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
## 2015-07-26 MED ORDER — SULFAMETHOXAZOLE-TRIMETHOPRIM 800-160 MG PO TABS
1.0000 | ORAL_TABLET | Freq: Two times a day (BID) | ORAL | Status: DC
  Administered 2015-07-26 – 2015-07-29 (×6): 1 via ORAL
  Filled 2015-07-26 (×6): qty 1

## 2015-07-26 MED ORDER — RISPERIDONE 1 MG PO TABS
2.0000 mg | ORAL_TABLET | Freq: Two times a day (BID) | ORAL | Status: DC
  Administered 2015-07-26 – 2015-07-29 (×6): 2 mg via ORAL
  Filled 2015-07-26 (×7): qty 2

## 2015-07-26 MED ORDER — LORAZEPAM 1 MG PO TABS
1.0000 mg | ORAL_TABLET | Freq: Two times a day (BID) | ORAL | Status: DC
Start: 2015-07-26 — End: 2015-07-29
  Administered 2015-07-26 – 2015-07-29 (×6): 1 mg via ORAL
  Filled 2015-07-26 (×6): qty 1

## 2015-07-26 NOTE — Plan of Care (Signed)
Problem: Alteration in mood; excessive anxiety as evidenced by: Goal: STG-Patient can identify triggers for anxiety Outcome: Not Progressing Patient was insisting for discharge & got aggressive.

## 2015-07-26 NOTE — Progress Notes (Signed)
Patient had a manual hold today from 2:05 to 2:09.  He was placed on one-to-one with security. He had a second manual hold the medication administration at 2:20.  He was released at 2:22  He received Haldol 5 mg IM, Benadryl 50 mg IM and Ativan 2 mg IM  Per nurse's patient was trying to elope. He did not responded to redirection from nursing. He had to be placed in the lock unit with a one-to-one Engineer, materials. He started banging loudly on glass and doors.    Patient is demanding discharge. He was to be discharge so he can return to [name].

## 2015-07-26 NOTE — [facility] Group Notes (Signed)
[facility] LCSW Aftercare Discharge Planning Group Note   07/26/2015 3:35 PM  Participation Quality:  Did not attend.   Natalina Wieting L Johntae Broxterman MSW, [address]

## 2015-07-26 NOTE — Progress Notes (Signed)
Patient was insisting for discharge since this morning.Interpreter called & tried to explained to patient about the discharge plan.He was crying in the hallway for discharge.When the pharmacy staff was going out of the unit patient got out to the saliport.Patient refused to come back to the unit & hold on to the rods.Applied two person manual hold & made patient sit in the wheelchair to his room.He was hitting the glasses,crying & yelling in the room.Patient was medicated with manual hold.He calm down after few minutes & sleeping.

## 2015-07-26 NOTE — [facility] Group Notes (Signed)
[facility] Group Notes:  (Nursing/MHT/Case Management/Adjunct)  Date:  07/26/2015  Time:  12:30 PM  Type of Therapy:  Psychoeducational Skills  Participation Level:  None  Participation Quality:  Inattentive  Affect:  Flat  Cognitive:  Confused  Insight:  Limited  Engagement in Group:  None  Modes of Intervention:  Activity and Problem-solving  Summary of Progress/Problems:  Jeremiah [name] 07/26/2015, 12:30 PM

## 2015-07-26 NOTE — Progress Notes (Signed)
D: Pt denies SI/HI/AVH. Pt is pleasant and cooperative,limited english, use of interpreter used to communicate effectively. Patient's affect is flat, and sad, not  interacting with peers, not attending group, he appears  anxious and he was medicated as needed. .  A: Pt was offered support and encouragement. Pt was given scheduled medications. Pt was encouraged to attend groups. Q 15 minute checks were done for safety.  R:Pt did not attend evening group. Pt is taking medication. Pt has no complaints.Pt receptive to treatment and safety maintained on unit.
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
## 2015-07-26 NOTE — CIRT (Signed)
Post Seclusion/Restraint Episode Mini Treatment Team  Date of Seclusion or Restraint Episode:  1420 Today's Date:  07/26/2015  List of Patient Triggers/Skill Deficits: Patient is upset that he cannot discharge home.   Review of Medications:  Current Facility-Administered Medications  Medication Dose Route Frequency Provider Last Rate Last Dose  . acetaminophen (TYLENOL) tablet 650 mg  650 mg Oral Q6H PRN Jimmy Footman, MD   650 mg at 07/24/15 2141  . alum & mag hydroxide-simeth (MAALOX/MYLANTA) 200-200-20 MG/5ML suspension 30 mL  30 mL Oral Q4H PRN Jimmy Footman, MD      . aspirin EC tablet 81 mg  81 mg Oral Daily Jimmy Footman, MD   81 mg at 07/26/15 4098  . diphenhydrAMINE (BENADRYL) capsule 50 mg  50 mg Oral Q8H PRN Jimmy Footman, MD   50 mg at 07/25/15 0039   Or  . diphenhydrAMINE (BENADRYL) injection 50 mg  50 mg Intramuscular Q8H PRN Jimmy Footman, MD   50 mg at 07/26/15 1420  . docusate sodium (COLACE) capsule 200 mg  200 mg Oral BID Jimmy Footman, MD   200 mg at 07/26/15 1191  . haloperidol (HALDOL) tablet 5 mg  5 mg Oral Q8H PRN Jimmy Footman, MD   5 mg at 07/25/15 0039   Or  . haloperidol lactate (HALDOL) injection 5 mg  5 mg Intramuscular Q8H PRN Jimmy Footman, MD   5 mg at 07/26/15 1420  . levofloxacin (LEVAQUIN) tablet 750 mg  750 mg Oral q1800 Jimmy Footman, MD   750 mg at 07/25/15 1717  . LORazepam (ATIVAN) tablet 2 mg  2 mg Oral TID PRN Jimmy Footman, MD   2 mg at 07/26/15 1247   Or  . LORazepam (ATIVAN) injection 2 mg  2 mg Intramuscular TID PRN Jimmy Footman, MD   2 mg at 07/26/15 1420  . LORazepam (ATIVAN) tablet 1 mg  1 mg Oral BID Jimmy Footman, MD      . magnesium hydroxide (MILK OF MAGNESIA) suspension 30 mL  30 mL Oral Daily PRN Jimmy Footman, MD      . meclizine (ANTIVERT) tablet 25 mg  25 mg Oral TID PRN Jimmy Footman, MD      . naproxen (NAPROSYN) tablet 250 mg  250 mg Oral BID PRN Jimmy Footman, MD      . nortriptyline (PAMELOR) capsule 25 mg  25 mg Oral QHS Jimmy Footman, MD   25 mg at 07/25/15 2230  . pantoprazole (PROTONIX) EC tablet 40 mg  40 mg Oral Daily Jimmy Footman, MD   40 mg at 07/25/15 1717  . risperiDONE (RISPERDAL) tablet 2 mg  2 mg Oral BID Jimmy Footman, MD      . traMADol Janean Sark) tablet 50 mg  50 mg Oral TID PRN Jimmy Footman, MD   50 mg at 07/26/15 1247    Compliant with Medications:  Yes  Need for Medication Adjustment:  Yes  Plan to Prevent Future Episodes of Seclusion and Restraint:    Staff Present:  Physician Dr Ardyth Harps  Nurse Practitioner/PA   Pharmacist   Nurse Raymel Cull  Nurse Everardo Pacific    Nurse Jamesetta So  MHT/NT   Counselor/Case Manager   Department Leadership   Security Crystal    [place] Lulamae Skorupski 07/26/2015, 3:49 PM

## 2015-07-26 NOTE — Plan of Care (Signed)
Problem: Alteration in mood; excessive anxiety as evidenced by: Goal: LTG-Patient's behavior demonstrates decreased anxiety (Patient's behavior demonstrates anxiety and he/she is utilizing learned coping skills to deal with anxiety-producing situations)  Outcome: Progressing Patient expressed some mild anxiety and was supported and medicated as needed.

## 2015-07-26 NOTE — CIRT (Signed)
Post Seclusion/Restraint Episode Mini Treatment Team  Date of Seclusion or Restraint Episode:  1405 Today's Date:  07/26/2015  List of Patient Triggers/Skill Deficits: Patient upset that he cannot discharge home.   Review of Medications:  Current Facility-Administered Medications  Medication Dose Route Frequency Provider Last Rate Last Dose  . acetaminophen (TYLENOL) tablet 650 mg  650 mg Oral Q6H PRN Jimmy Footman, MD   650 mg at 07/24/15 2141  . alum & mag hydroxide-simeth (MAALOX/MYLANTA) 200-200-20 MG/5ML suspension 30 mL  30 mL Oral Q4H PRN Jimmy Footman, MD      . aspirin EC tablet 81 mg  81 mg Oral Daily Jimmy Footman, MD   81 mg at 07/26/15 1610  . diphenhydrAMINE (BENADRYL) capsule 50 mg  50 mg Oral Q8H PRN Jimmy Footman, MD   50 mg at 07/25/15 0039   Or  . diphenhydrAMINE (BENADRYL) injection 50 mg  50 mg Intramuscular Q8H PRN Jimmy Footman, MD   50 mg at 07/26/15 1420  . docusate sodium (COLACE) capsule 200 mg  200 mg Oral BID Jimmy Footman, MD   200 mg at 07/26/15 9604  . haloperidol (HALDOL) tablet 5 mg  5 mg Oral Q8H PRN Jimmy Footman, MD   5 mg at 07/25/15 0039   Or  . haloperidol lactate (HALDOL) injection 5 mg  5 mg Intramuscular Q8H PRN Jimmy Footman, MD   5 mg at 07/26/15 1420  . levofloxacin (LEVAQUIN) tablet 750 mg  750 mg Oral q1800 Jimmy Footman, MD   750 mg at 07/25/15 1717  . LORazepam (ATIVAN) tablet 2 mg  2 mg Oral TID PRN Jimmy Footman, MD   2 mg at 07/26/15 1247   Or  . LORazepam (ATIVAN) injection 2 mg  2 mg Intramuscular TID PRN Jimmy Footman, MD   2 mg at 07/26/15 1420  . LORazepam (ATIVAN) tablet 1 mg  1 mg Oral BID Jimmy Footman, MD      . magnesium hydroxide (MILK OF MAGNESIA) suspension 30 mL  30 mL Oral Daily PRN Jimmy Footman, MD      . meclizine (ANTIVERT) tablet 25 mg  25 mg Oral TID PRN Jimmy Footman, MD      . naproxen (NAPROSYN) tablet 250 mg  250 mg Oral BID PRN Jimmy Footman, MD      . nortriptyline (PAMELOR) capsule 25 mg  25 mg Oral QHS Jimmy Footman, MD   25 mg at 07/25/15 2230  . pantoprazole (PROTONIX) EC tablet 40 mg  40 mg Oral Daily Jimmy Footman, MD   40 mg at 07/25/15 1717  . risperiDONE (RISPERDAL) tablet 2 mg  2 mg Oral BID Jimmy Footman, MD      . traMADol Janean Sark) tablet 50 mg  50 mg Oral TID PRN Jimmy Footman, MD   50 mg at 07/26/15 1247    Compliant with Medications:  Yes  Need for Medication Adjustment:  Yes  Plan to Prevent Future Episodes of Seclusion and Restraint:   Staff Present:  Physician Dr Ardyth Harps  Nurse Practitioner/PA   Pharmacist   Nurse Maximillian Habibi  Nurse Everardo Pacific  Nurse Jamesetta So  MHT/NT   Counselor/Case Manager   Department Leadership   Security Crystal    [place] Anjelo Pullman 07/26/2015, 3:48 PM

## 2015-07-26 NOTE — Progress Notes (Signed)
Recreation Therapy Notes  Date: 01.20.17 Time: 3:00 pm Location: Craft Room  Group Topic: Coping Skills  Goal Area(s) Addresses:  Patient will participate in healthy coping skill.  Patient will verbalize one emotion experienced in group.  Behavioral Response: Did not attend  Intervention: Coloring  Activity: Patients were given coloring sheets and instructed to color.  Education: LRT educated patients to color.  Education Outcome: Patient did not attend group.   Clinical Observations/Feedback: Patient did not attend group.  Jacquelynn Cree, LRT/CTRS 07/26/2015 4:32 PM
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
## 2015-07-27 DIAGNOSIS — F2089 Other schizophrenia: Secondary | ICD-10-CM

## 2015-07-27 LAB — RPR: RPR Ser Ql: NONREACTIVE

## 2015-07-27 NOTE — [facility] Group Notes (Signed)
[facility] LCSW Group Therapy  07/27/2015 1:39 PM  Type of Therapy:  Group Therapy  Participation Level:  Did Not Attend  Modes of Intervention:  Discussion, Education, Socialization and Support  Summary of Progress/Problems: Feelings around Relapse. Group members discussed the meaning of relapse and shared personal stories of relapse, how it affected them and others, and how they perceived themselves during this time. Group members were encouraged to identify triggers, warning signs and coping skills used when facing the possibility of relapse. Social supports were discussed and explored in detail.    Xander Jutras L Babe Anthis MSW, LCSWA  07/27/2015, 1:39 PM   

## 2015-07-27 NOTE — Plan of Care (Signed)
Problem: Ineffective individual coping Goal: STG: Patient will remain free from self harm Outcome: Progressing Patient is displaying no behaviors suggestive of a desire to harm himself
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
## 2015-07-27 NOTE — Plan of Care (Signed)
Problem: Alteration in mood; excessive anxiety as evidenced by: Goal: LTG-Patient's behavior demonstrates decreased anxiety (Patient's behavior demonstrates anxiety and he/she is utilizing learned coping skills to deal with anxiety-producing situations)  Outcome: Progressing Patient appears less anxious.

## 2015-07-27 NOTE — Progress Notes (Signed)
D:  Patient is alert and oriented on the unit this shift.  Patient did not attend or participate in groups today.  Patient speaks no [place].  Patient denies suicidal ideation, homicidal ideation, auditory or visual hallucinations currently while on the phone with interpreter.   A:  Scheduled medications administered as per MD orders.  Patient maintained on 1:1 safety sitter due to elopement attempt yesterday.  Patient is informed to notify staff with any questions or concerns. R:  No adverse medication reactions noted.  Patient was cooperative with medication administration and treatment plan.  Patient was receptive, calm and cooperative at this time.  Patient remains safe on the unit at the current time.

## 2015-07-27 NOTE — Plan of Care (Signed)
Problem: Ineffective individual coping Goal: LTG: Patient will report a decrease in negative feelings Outcome: Progressing Patient reports feeling more calm and comfortable today

## 2015-07-27 NOTE — Progress Notes (Signed)
D: Pt denies SI/HI/AVH. Pt is pleasant and cooperative, affect is flat and sad but brightens upon approach. Patient  appears less anxious, not interacting with peers because of language barrier.  A: Pt was offered support and encouragement. Pt was given scheduled medications. Pt was encouraged to attend groups. Q 15 minute checks were done for safety.  R:Pt did attend evening groups, no interaction with peers. Pt is taking medication. Pt has no complaints.Pt receptive to treatment and safety maintained on unit.

## 2015-07-27 NOTE — [facility] Group Notes (Signed)
[facility] LCSW Group Therapy  07/27/2015 10:50 AM Late entry for 07/26/2015  Type of Therapy:  Group Therapy  Participation Level:  Did Not Attend  Modes of Intervention:  Discussion, Education, Socialization and Support  Summary of Progress/Problems: Balance in life: Patients will discuss the concept of balance and how it looks and feels to be unbalanced. Pt will identify areas in their life that is unbalanced and ways to become more balanced.    Jeremiah [name] L Jeremiah [name] MSW, LCSWA  07/27/2015, 10:50 AM

## 2015-07-28 DIAGNOSIS — F2089 Other schizophrenia: Secondary | ICD-10-CM | POA: Diagnosis not present

## 2015-07-28 NOTE — [facility] Group Notes (Signed)
[facility] LCSW Group Therapy  07/28/2015 3:05 PM  Type of Therapy:  Group Therapy  Participation Level:  Minimal  Participation Quality:  Attentive  Affect:  Appropriate  Cognitive:  Alert  Insight:  Limited  Engagement in Therapy:  Limited  Modes of Intervention:  Discussion, Education, Socialization and Support  Summary of Progress/Problems:  Mindfulness: Patient discussed mindfulness and relaxing techniques and why they are beneficial. Pt discussed ways to incorporate mindfulness in their lives. Pt practiced a mindfulness techique and discussed how it made them feel. Pt attended group and stayed most of the time. Pt sat quietly and listened to group members share.   Alani Lacivita L Vester Titsworth MSW, LCSWA  07/28/2015, 3:05 PM   

## 2015-07-28 NOTE — Progress Notes (Signed)
Patient continues to be on 1:1 for elopement risk and safety precautions, affect is flat and sad, mood is depressed,  He appears restless. Writer made communication accessible via interpreter which was effective. Patient is receptive to care and treatment plan. Patient is on ABT/L leg injury, afebrile no acute distress noted, 15 minutes checks maintained, will continue to monitor.
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
## 2015-07-28 NOTE — Plan of Care (Signed)
Problem: Ineffective individual coping Goal: LTG: Patient will report a decrease in negative feelings Outcome: Progressing Patient denies SI/HI, via interpreter.

## 2015-07-28 NOTE — Plan of Care (Signed)
Problem: Consults Goal: [facility] General Treatment Patient Education Outcome: Progressing Cooperative with current plan of care.      

## 2015-07-29 DIAGNOSIS — F23 Brief psychotic disorder: Secondary | ICD-10-CM | POA: Diagnosis not present

## 2015-07-29 MED ORDER — DOCUSATE SODIUM 100 MG PO CAPS: 200.0000 mg | ORAL_CAPSULE | Freq: Two times a day (BID) | ORAL | Status: AC

## 2015-07-29 MED ORDER — SULFAMETHOXAZOLE-TRIMETHOPRIM 800-160 MG PO TABS: 1.0000 | ORAL_TABLET | Freq: Two times a day (BID) | ORAL | Status: AC

## 2015-07-29 MED ORDER — NORTRIPTYLINE HCL 25 MG PO CAPS: 25.0000 mg | ORAL_CAPSULE | Freq: Every day | ORAL | Status: AC

## 2015-07-29 MED ORDER — MECLIZINE HCL 25 MG PO TABS: 25.0000 mg | ORAL_TABLET | Freq: Three times a day (TID) | ORAL | Status: AC | PRN

## 2015-07-29 MED ORDER — PANTOPRAZOLE SODIUM 40 MG PO TBEC: 40.0000 mg | DELAYED_RELEASE_TABLET | Freq: Every day | ORAL | Status: AC

## 2015-07-29 MED ORDER — ASPIRIN 81 MG PO TBEC: 81.0000 mg | DELAYED_RELEASE_TABLET | Freq: Every day | ORAL | Status: AC

## 2015-07-29 MED ORDER — RISPERIDONE 2 MG PO TABS: 2.0000 mg | ORAL_TABLET | Freq: Two times a day (BID) | ORAL | Status: AC
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
## 2015-07-29 NOTE — Progress Notes (Signed)
Patient denies SI/HI, denies A/V hallucinations. Patient verbalizes understanding of discharge instructions, follow up care and prescriptions. Patient given all belongings from  locker. Patient escorted out by staff, transported by family. 
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
## 2015-07-29 NOTE — Progress Notes (Signed)
Patient compliant with medications. Patient denies pain. Patient states that he wants to go home and voices his concern over doctor appointment tomorrow. Patient told this Clinical research associate (interpreter used) that he will sleep in the hall and cry if he cannot go home. This Clinical research associate informed patient of unit rules and encouraged patient to try to get some rest. No unsafe behavior noted. Will continue to monitor.
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
## 2015-07-29 NOTE — Progress Notes (Signed)
  [facility] Adult Case Management Discharge Plan :  Will you be returning to the same living situation after discharge:  Yes,  pt will be retuning home to live in [place] with his family At discharge, do you have transportation home?: Yes,  pt will be picked up by his brother Do you have the ability to pay for your medications: Yes,  pt will be provided with prescriptions at discharge  Release of information consent forms completed and in the chart;  Patient's signature needed at discharge.  Patient to Follow up at: Follow-up Information    Follow up with [facility]  On 07/30/2015.   Why:  Please arrive to your appointment at the clinic with Dr. Barbra Sarks at 10:30 am on Tuesday, January 23rd, 2017   Contact information:   [facility]   [address] [place], [place] [address]  Ph: [phone] Fax:[phone]      Follow up with [facility].   Why:  Please arrive on January Wednesday, 2017 at 11:15am for your hospital follow up for medication management    Contact information:   [address] [place], [place] [address] Phone: [phone] Fax [phone]:       Follow up with [facility].   Why:  Please arrive to the walk-in clinic between the hours of 9am-4pm Monday thru Friday for an assessment for medication management and therapy   Contact information:   [address] [place], [place] [address] [phone] Fax: [phone]      Follow up with [facility].   Why:  The Care center will be calling you to set your appointment in the next few days.  Please call to set your appointment if you do not hear from them soon for an assessment for medication management and therapy.   Contact information:   [address], [place], [place] [address] Phone: [phone] Fax: [phone]      Next level of care provider has access to [facility] Link:no  Safety Planning  and Suicide Prevention discussed: Yes,  pt refused  Have you used any form of tobacco in the last 30 days? (Cigarettes, Smokeless Tobacco, Cigars, and/or Pipes): No  Has patient been referred to the Quitline?: N/A patient is not a smoker  Patient has been referred for addiction treatment: N/A  Mercy Riding 07/29/2015, 12:43 PM

## 2015-07-30 ENCOUNTER — Encounter: Payer: Self-pay | Admitting: Physical Therapy

## 2015-07-31 ENCOUNTER — Ambulatory Visit (INDEPENDENT_AMBULATORY_CARE_PROVIDER_SITE_OTHER): Payer: Medicaid Other | Admitting: Family Medicine

## 2015-07-31 VITALS — BP 113/61 | HR 103 | Temp 97.6°F | Wt 124.0 lb

## 2015-07-31 DIAGNOSIS — M86262 Subacute osteomyelitis, left tibia and fibula: Secondary | ICD-10-CM | POA: Diagnosis not present

## 2015-07-31 DIAGNOSIS — F23 Brief psychotic disorder: Secondary | ICD-10-CM

## 2015-07-31 DIAGNOSIS — S82202H Unspecified fracture of shaft of left tibia, subsequent encounter for open fracture type I or II with delayed healing: Secondary | ICD-10-CM

## 2015-07-31 DIAGNOSIS — S8292XH Unspecified fracture of left lower leg, subsequent encounter for open fracture type I or II with delayed healing: Secondary | ICD-10-CM

## 2015-07-31 DIAGNOSIS — S82402H Unspecified fracture of shaft of left fibula, subsequent encounter for open fracture type I or II with delayed healing: Secondary | ICD-10-CM

## 2015-07-31 NOTE — Patient Instructions (Signed)
Thank you for coming to see me today. It was a pleasure. Today we talked about:   Recent hospitalization: please continue to take your antibiotic (Septra) until February 8th. Please try to follow-up with Psychiatry as soon as possible. Please make an appointment with our [facility] in one month, or sooner. Also, please follow-up with your primary care physician, Dr. Nadine Counts for routine care.  If you have any questions or concerns, please do not hesitate to call the office at [phone].  Sincerely,  Jacquelin Hawking, MD
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
## 2015-08-01 ENCOUNTER — Encounter: Payer: Self-pay | Admitting: Physical Therapy

## 2015-08-02 ENCOUNTER — Encounter: Payer: Self-pay | Admitting: Family Medicine

## 2015-08-02 LAB — FUNGUS CULTURE W SMEAR: FUNGAL SMEAR: NONE SEEN

## 2015-08-14 ENCOUNTER — Ambulatory Visit: Payer: Self-pay

## 2015-08-15 LAB — AFB CULTURE WITH SMEAR (NOT AT ARMC): Acid Fast Smear: NONE SEEN

## 2015-08-17 LAB — AFB CULTURE WITH SMEAR (NOT AT ARMC): Acid Fast Smear: NONE SEEN

## 2015-11-12 ENCOUNTER — Ambulatory Visit: Payer: Medicaid Other | Admitting: Nurse Practitioner

## 2015-11-13 ENCOUNTER — Encounter: Payer: Self-pay | Admitting: Nurse Practitioner
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
## 2016-03-23 IMAGING — CT CT HEAD W/O CM
2 series · 16 of 30 positions shown, 18 images · non-contrast
Comparison: July 17, 2015 head CT; brain MRI March 30, 2015

CLINICAL DATA: Confusion for 1 week.  Altered mental status.

EXAM:
CT HEAD WITHOUT CONTRAST
TECHNIQUE: Contiguous axial images were obtained from the base of the skull
through the vertex without intravenous contrast.

[Series 201: head w/o, idose (1) · axial · non-contrast · 0.42mm/px · z∈[+88,+208]mm · 8 of 32 slices shown, 10 images]
[im 4/32  brain]
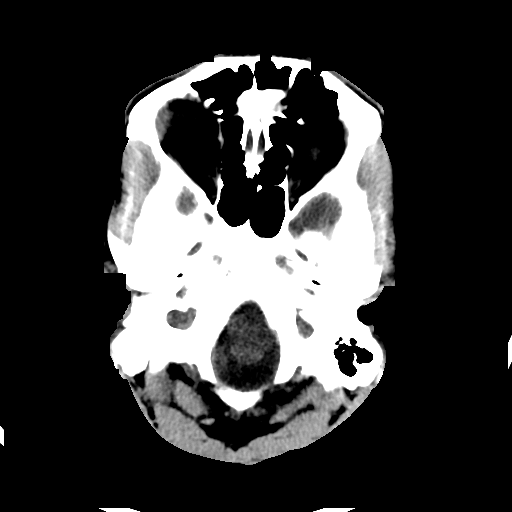
[im 4/32  bone]
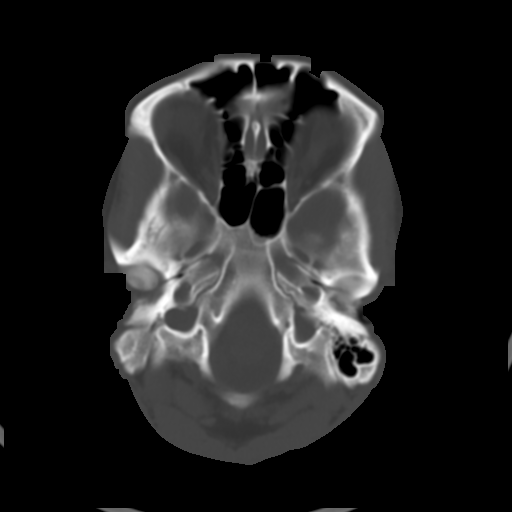
[im 7/32  brain]
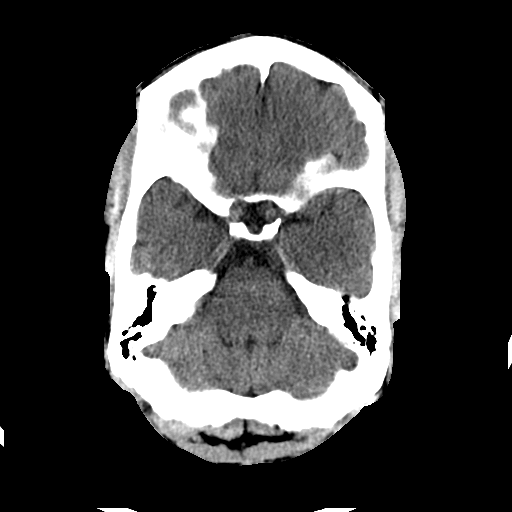
[im 11/32  brain]
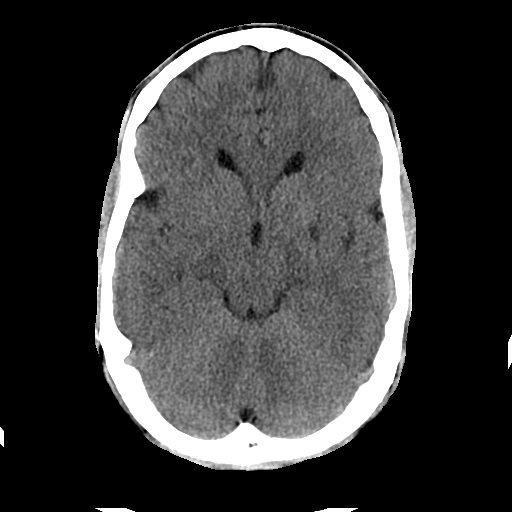
[im 14/32  brain]
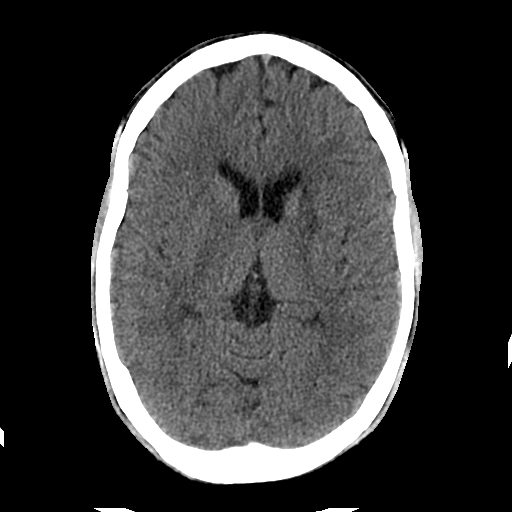
[im 18/32  brain]
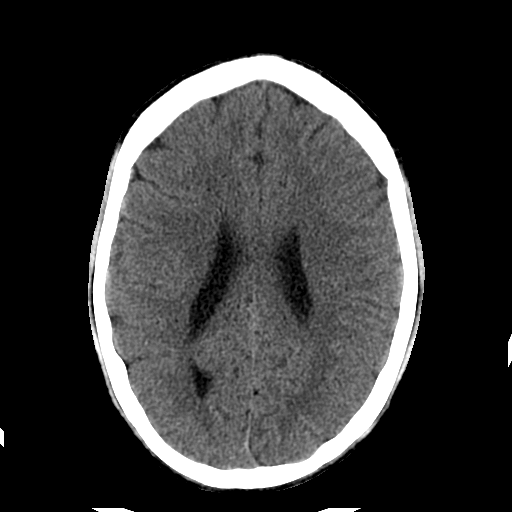
[im 18/32  bone]
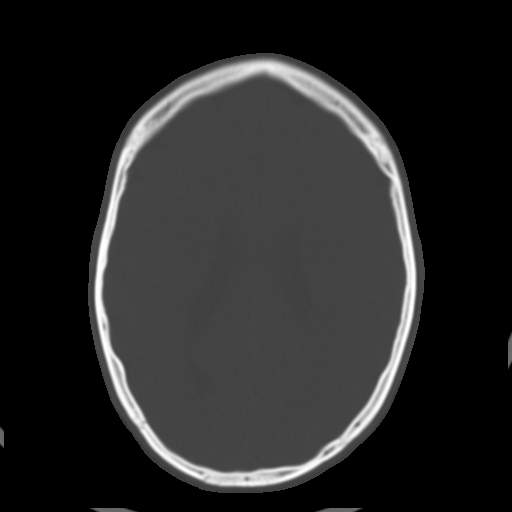
[im 21/32  brain]
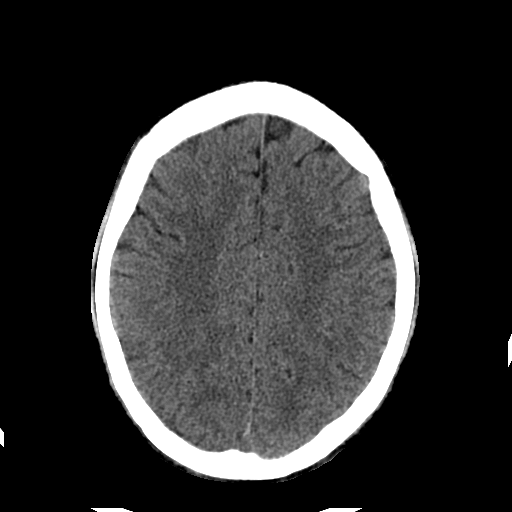
[im 25/32  brain]
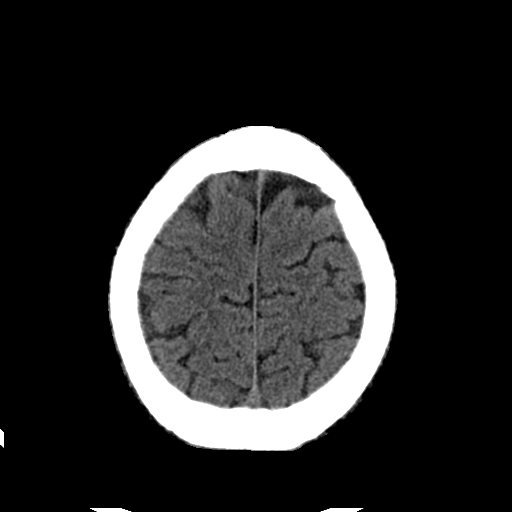
[im 28/32  brain]
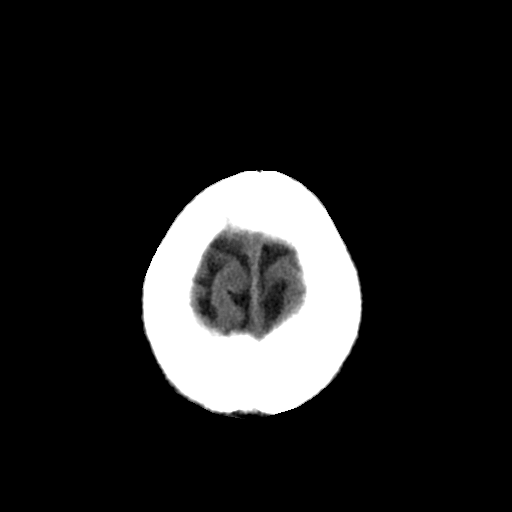

[Series 202: head w/o bone, idose (1) · axial · non-contrast · 0.42mm/px · z∈[+87,+212]mm · 8 of 64 slices shown]
[im 7/64  bone]
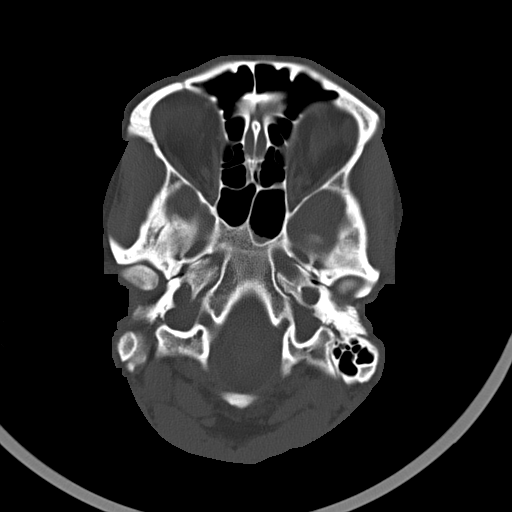
[im 14/64  bone]
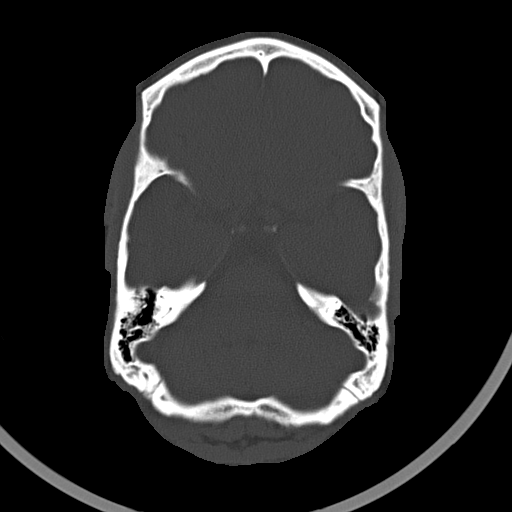
[im 20/64  bone]
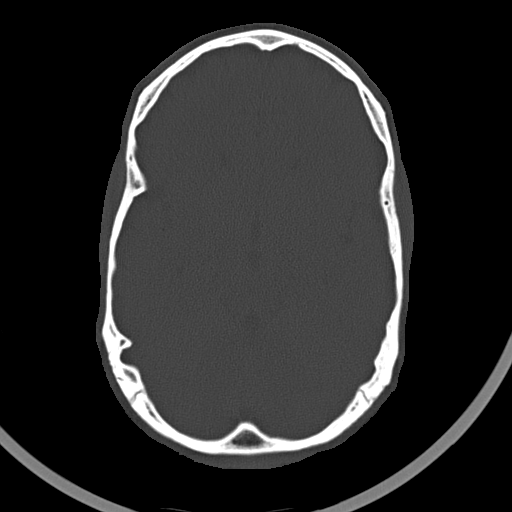
[im 27/64  bone]
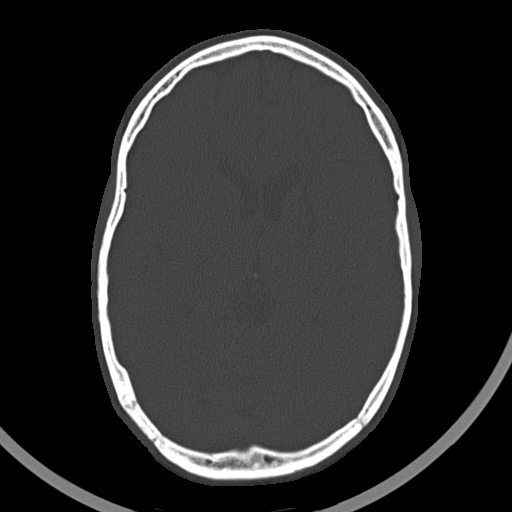
[im 37/64  bone]
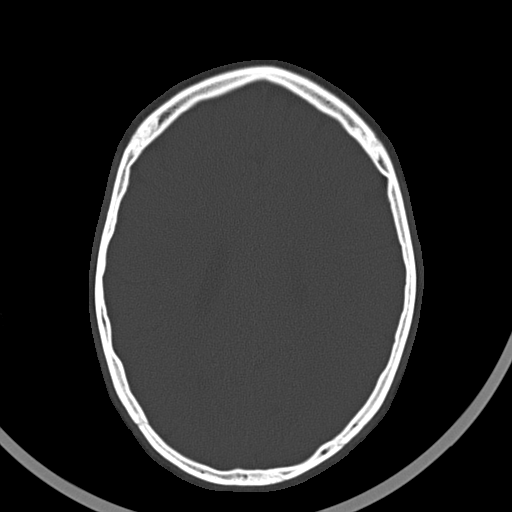
[im 44/64  bone]
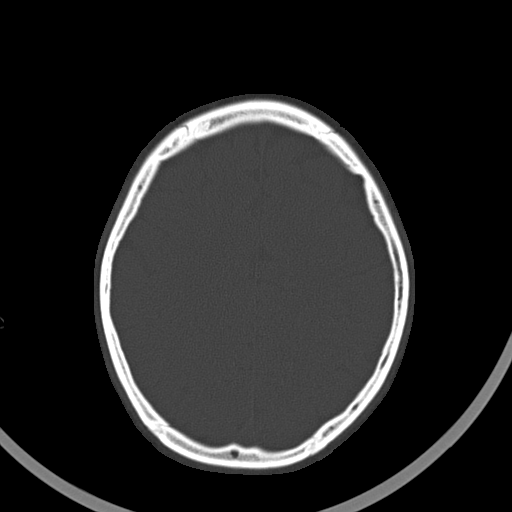
[im 50/64  bone]
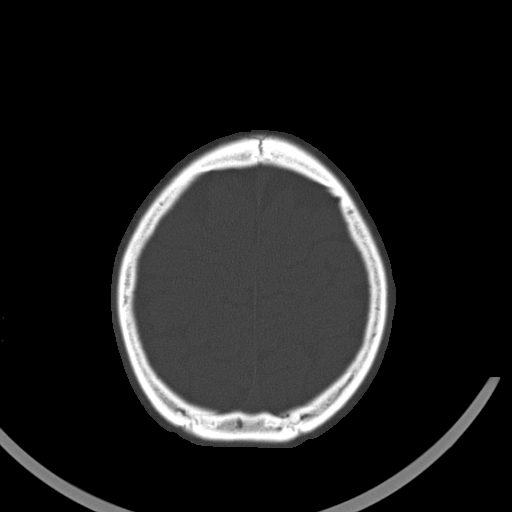
[im 57/64  bone]
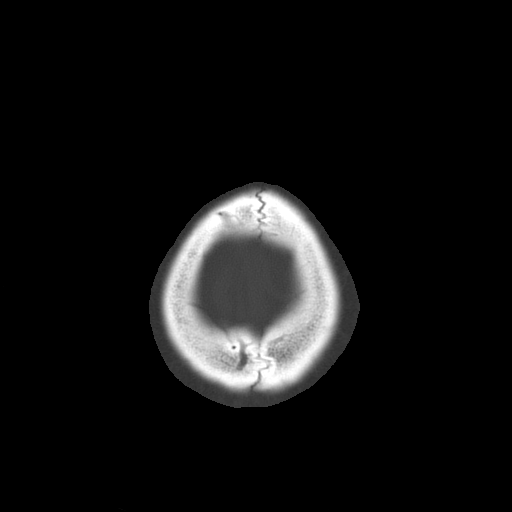

[16 of 30 positions shown; findings below may reference images not displayed]

FINDINGS: The ventricles are normal in size and configuration. There is no
intracranial mass, hemorrhage, extra-axial fluid collection, or
midline shift. There is a stable prior infarct in the anterior left
lentiform nucleus with involvement of a portion of the anterior
aspect of the left internal capsule. Elsewhere, gray-white
compartments appear normal. No acute infarct evident. The bony
calvarium appears intact. The mastoid air cells are clear.
Visualized orbits appear symmetric.
IMPRESSION: Prior left basal ganglia infarct, stable. No new gray-white
compartment lesions. No acute infarct. No hemorrhage or mass effect.
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
